# Patient Record
Sex: Female | Born: 1985 | Race: Asian | Hispanic: No | Marital: Married | Smoking: Never smoker
Health system: Southern US, Community
[De-identification: ages and names within clinical notes are randomized; demographics above are authoritative.]

## PROBLEM LIST (undated history)

## (undated) ENCOUNTER — Inpatient Hospital Stay: Payer: Self-pay

## (undated) DIAGNOSIS — Z789 Other specified health status: Secondary | ICD-10-CM

## (undated) DIAGNOSIS — E282 Polycystic ovarian syndrome: Secondary | ICD-10-CM

## (undated) DIAGNOSIS — D62 Acute posthemorrhagic anemia: Secondary | ICD-10-CM

## (undated) DIAGNOSIS — IMO0001 Reserved for inherently not codable concepts without codable children: Secondary | ICD-10-CM

## (undated) HISTORY — PX: DILATION AND CURETTAGE OF UTERUS: SHX78

## (undated) HISTORY — DX: Polycystic ovarian syndrome: E28.2

## (undated) HISTORY — PX: NO PAST SURGERIES: SHX2092

---

## 2014-03-11 NOTE — L&D Delivery Note (Signed)
Delivery Note At 9:35 AM a viable and healthy female was delivered via Vaginal, Spontaneous Delivery (Presentation: Right Occiput Anterior) after left mediolateral episiotomy due to tight hymenal/ perineal band.  APGAR: 8, 9; weight  -pending  Placenta status: Intact, Spontaneous.  Cord: 3 vessels with the following complications: .  Cord pH: N/A Anesthesia: Epidural and Local 1% Lidocaine in episiotomy area  Episiotomy: Left Mediolateral Lacerations: 2nd degree (no extension) Suture Repair: 3.0  Vicryl rapide Est. Blood Loss (mL): 350  Mom to AICU for postpartum magnesium for preeclampsia.  Baby to Couplet care / Skin to Skin.  MODY,Sheri Watkins 11/27/2014, 10:17 AM

## 2014-04-21 LAB — OB RESULTS CONSOLE HEPATITIS B SURFACE ANTIGEN: HEP B S AG: NEGATIVE

## 2014-04-21 LAB — OB RESULTS CONSOLE RUBELLA ANTIBODY, IGM: Rubella: IMMUNE

## 2014-04-21 LAB — OB RESULTS CONSOLE ABO/RH: RH TYPE: POSITIVE

## 2014-04-21 LAB — OB RESULTS CONSOLE HIV ANTIBODY (ROUTINE TESTING): HIV: NONREACTIVE

## 2014-04-21 LAB — OB RESULTS CONSOLE ANTIBODY SCREEN: ANTIBODY SCREEN: NEGATIVE

## 2014-04-21 LAB — OB RESULTS CONSOLE RPR: RPR: NONREACTIVE

## 2014-05-04 LAB — OB RESULTS CONSOLE GC/CHLAMYDIA
Chlamydia: NEGATIVE
GC PROBE AMP, GENITAL: NEGATIVE

## 2014-11-14 ENCOUNTER — Encounter (HOSPITAL_COMMUNITY): Payer: Self-pay | Admitting: *Deleted

## 2014-11-14 ENCOUNTER — Inpatient Hospital Stay (HOSPITAL_COMMUNITY)
Admission: AD | Admit: 2014-11-14 | Discharge: 2014-11-14 | Disposition: A | Discharge status: Home / Self Care | Payer: 59 | Source: Ambulatory Visit | Attending: Obstetrics | Admitting: Obstetrics

## 2014-11-14 DIAGNOSIS — Z3A37 37 weeks gestation of pregnancy: Secondary | ICD-10-CM | POA: Diagnosis not present

## 2014-11-14 DIAGNOSIS — R109 Unspecified abdominal pain: Secondary | ICD-10-CM | POA: Diagnosis present

## 2014-11-14 HISTORY — DX: Other specified health status: Z78.9

## 2014-11-14 LAB — COMPREHENSIVE METABOLIC PANEL
ALK PHOS: 154 U/L — AB (ref 38–126)
ALT: 11 U/L — ABNORMAL LOW (ref 14–54)
ANION GAP: 9 (ref 5–15)
AST: 18 U/L (ref 15–41)
Albumin: 2.7 g/dL — ABNORMAL LOW (ref 3.5–5.0)
BUN: 12 mg/dL (ref 6–20)
CALCIUM: 9.4 mg/dL (ref 8.9–10.3)
CO2: 23 mmol/L (ref 22–32)
Chloride: 102 mmol/L (ref 101–111)
Creatinine, Ser: 0.47 mg/dL (ref 0.44–1.00)
GFR calc non Af Amer: >60 mL/min (ref >60)
Glucose, Bld: 86 mg/dL (ref 65–99)
Potassium: 4.2 mmol/L (ref 3.5–5.1)
SODIUM: 134 mmol/L — AB (ref 135–145)
TOTAL PROTEIN: 6.6 g/dL (ref 6.5–8.1)
Total Bilirubin: 0.5 mg/dL (ref 0.3–1.2)

## 2014-11-14 LAB — URINALYSIS, DIPSTICK ONLY
BILIRUBIN URINE: NEGATIVE
Glucose, UA: NEGATIVE mg/dL
HGB URINE DIPSTICK: NEGATIVE
KETONES UR: NEGATIVE mg/dL
Nitrite: NEGATIVE
PROTEIN: NEGATIVE mg/dL
Specific Gravity, Urine: 1.01 (ref 1.005–1.030)
UROBILINOGEN UA: 0.2 mg/dL (ref 0.0–1.0)
pH: 7.5 (ref 5.0–8.0)

## 2014-11-14 LAB — CBC
HCT: 31.3 % — ABNORMAL LOW (ref 36.0–46.0)
HEMOGLOBIN: 10.2 g/dL — AB (ref 12.0–15.0)
MCH: 25.6 pg — AB (ref 26.0–34.0)
MCHC: 32.6 g/dL (ref 30.0–36.0)
MCV: 78.6 fL (ref 78.0–100.0)
Platelets: 223 10*3/uL (ref 150–400)
RBC: 3.98 MIL/uL (ref 3.87–5.11)
RDW: 18.3 % — ABNORMAL HIGH (ref 11.5–15.5)
WBC: 11.1 10*3/uL — ABNORMAL HIGH (ref 4.0–10.5)

## 2014-11-14 LAB — URIC ACID: URIC ACID, SERUM: 4.6 mg/dL (ref 2.3–6.6)

## 2014-11-14 NOTE — H&P (Signed)
Chief complaint: Abdominal pain  History of present illness: 29 year old G2 P0 010 at 37 weeks and 5 days who presents with one hour of abdominal pain around 8:30 this a.m. Patient notes intense abdominal cramping that brought her to tears. Good fetal movement, no leakage of fluid, no vaginal bleeding. Patient reports no headache, no vision change. Patient does report intense itching on her abdomen that worsens after hot shower  Obstetric history: GBS negative, normotensive throughout pregnancy, multiple complaints of abdominal pain throughout her pregnancy  Physical exam: Filed Vitals:   11/14/14 1019 11/14/14 1029 11/14/14 1048 11/14/14 1117  BP: 149/90 126/82 132/87 138/87  Pulse: 82 83 79 84  Temp: 98.1 F (36.7 C)     TempSrc: Oral     Resp: 18     Height:  (1.6 m)     Weight: 83.462 kg (184 lb)      general: Well-appearing, no distress Cardiovascular: Regular rate and rhythm Pulmonary: Clear to auscultation bilaterally Back: No costovertebral angle tenderness Abdomen: Gravid, size greater than dates, no fundal tenderness, no right upper quadrant pain Skin: Abdominal street a with fine macular papular red rash Lower extremities: Trace edema, nontender, 1+ DTR, no clonus  GU: Per nurse: Fingertip, long  Toco: None FH: 135, positive excels, no decelerations, 10 beat variability  CBC    Component Value Date/Time   WBC 11.1* 11/14/2014 1115   RBC 3.98 11/14/2014 1115   HGB 10.2* 11/14/2014 1115   HCT 31.3* 11/14/2014 1115   PLT 223 11/14/2014 1115   MCV 78.6 11/14/2014 1115   MCH 25.6* 11/14/2014 1115   MCHC 32.6 11/14/2014 1115   RDW 18.3* 11/14/2014 1115     CMP     Component Value Date/Time   NA 134* 11/14/2014 1115   K 4.2 11/14/2014 1115   CL 102 11/14/2014 1115   CO2 23 11/14/2014 1115   GLUCOSE 86 11/14/2014 1115   BUN 12 11/14/2014 1115   CREATININE 0.47 11/14/2014 1115   CALCIUM 9.4 11/14/2014 1115   PROT 6.6 11/14/2014 1115   ALBUMIN 2.7*  11/14/2014 1115   AST 18 11/14/2014 1115   ALT 11* 11/14/2014 1115   ALKPHOS 154* 11/14/2014 1115   BILITOT 0.5 11/14/2014 1115   GFRNONAA >60 11/14/2014 1115   GFRAA >60 11/14/2014 1115    Urine protein: Negative  Assessment and plan: 29 year old G2 P0 at 37 weeks and 5 days who presents with one hour of contractions for labor evaluation. No evidence of labor. Reactive fetal testing. While here patient had single elevated blood pressure. Patient has no other signs or symptoms of preeclampsia and labs are negative for preeclampsia. Labor and preeclamptic precautions reviewed with patient. Patient is to follow-up in 2 days time and has a scheduled routine obstetric visit with an ultrasound given her size greater than dates.  PUPPS. Finding discussed with patient. Okay for Benadryl at night, avoid hot showers, oatmeal bath recommend.  Antia Rahal A. 11/14/2014 12:55 PM

## 2014-11-14 NOTE — MAU Note (Signed)
States was having pain this morning at 0830, "too much pain." No pain right now. Wants to see what is going on.

## 2014-11-16 LAB — OB RESULTS CONSOLE GBS: STREP GROUP B AG: NEGATIVE

## 2014-11-26 ENCOUNTER — Encounter (HOSPITAL_COMMUNITY): Payer: Self-pay | Admitting: *Deleted

## 2014-11-26 ENCOUNTER — Inpatient Hospital Stay (HOSPITAL_COMMUNITY)
Admission: AD | Admit: 2014-11-26 | Discharge: 2014-11-29 | DRG: 774 | Disposition: A | Discharge status: Home / Self Care | Payer: 59 | Source: Ambulatory Visit | Attending: Obstetrics & Gynecology | Admitting: Obstetrics & Gynecology

## 2014-11-26 DIAGNOSIS — Z3A39 39 weeks gestation of pregnancy: Secondary | ICD-10-CM | POA: Diagnosis present

## 2014-11-26 DIAGNOSIS — O0903 Supervision of pregnancy with history of infertility, third trimester: Secondary | ICD-10-CM | POA: Diagnosis not present

## 2014-11-26 DIAGNOSIS — O1403 Mild to moderate pre-eclampsia, third trimester: Secondary | ICD-10-CM | POA: Diagnosis present

## 2014-11-26 DIAGNOSIS — D62 Acute posthemorrhagic anemia: Secondary | ICD-10-CM | POA: Diagnosis not present

## 2014-11-26 DIAGNOSIS — O9962 Diseases of the digestive system complicating childbirth: Secondary | ICD-10-CM | POA: Diagnosis present

## 2014-11-26 DIAGNOSIS — K219 Gastro-esophageal reflux disease without esophagitis: Secondary | ICD-10-CM | POA: Diagnosis present

## 2014-11-26 DIAGNOSIS — Z862 Personal history of diseases of the blood and blood-forming organs and certain disorders involving the immune mechanism: Secondary | ICD-10-CM | POA: Diagnosis present

## 2014-11-26 DIAGNOSIS — O09299 Supervision of pregnancy with other poor reproductive or obstetric history, unspecified trimester: Secondary | ICD-10-CM | POA: Diagnosis present

## 2014-11-26 DIAGNOSIS — O1490 Unspecified pre-eclampsia, unspecified trimester: Secondary | ICD-10-CM | POA: Diagnosis present

## 2014-11-26 DIAGNOSIS — O163 Unspecified maternal hypertension, third trimester: Secondary | ICD-10-CM | POA: Diagnosis present

## 2014-11-26 DIAGNOSIS — O9081 Anemia of the puerperium: Secondary | ICD-10-CM | POA: Diagnosis not present

## 2014-11-26 DIAGNOSIS — O1404 Mild to moderate pre-eclampsia, complicating childbirth: Secondary | ICD-10-CM | POA: Diagnosis present

## 2014-11-26 HISTORY — DX: Acute posthemorrhagic anemia: D62

## 2014-11-26 HISTORY — DX: Reserved for inherently not codable concepts without codable children: IMO0001

## 2014-11-26 LAB — COMPREHENSIVE METABOLIC PANEL
ALT: 14 U/L (ref 14–54)
ANION GAP: 8 (ref 5–15)
AST: 19 U/L (ref 15–41)
Albumin: 3 g/dL — ABNORMAL LOW (ref 3.5–5.0)
Alkaline Phosphatase: 190 U/L — ABNORMAL HIGH (ref 38–126)
BUN: 10 mg/dL (ref 6–20)
CHLORIDE: 104 mmol/L (ref 101–111)
CO2: 23 mmol/L (ref 22–32)
CREATININE: 0.43 mg/dL — AB (ref 0.44–1.00)
Calcium: 10 mg/dL (ref 8.9–10.3)
GFR calc non Af Amer: >60 mL/min (ref >60)
Glucose, Bld: 88 mg/dL (ref 65–99)
Potassium: 4.5 mmol/L (ref 3.5–5.1)
SODIUM: 135 mmol/L (ref 135–145)
Total Bilirubin: 0.3 mg/dL (ref 0.3–1.2)
Total Protein: 6.8 g/dL (ref 6.5–8.1)

## 2014-11-26 LAB — CBC
HCT: 33.4 % — ABNORMAL LOW (ref 36.0–46.0)
HEMOGLOBIN: 10.9 g/dL — AB (ref 12.0–15.0)
MCH: 26 pg (ref 26.0–34.0)
MCHC: 32.6 g/dL (ref 30.0–36.0)
MCV: 79.5 fL (ref 78.0–100.0)
Platelets: 233 10*3/uL (ref 150–400)
RBC: 4.2 MIL/uL (ref 3.87–5.11)
RDW: 18.9 % — ABNORMAL HIGH (ref 11.5–15.5)
WBC: 11.3 10*3/uL — ABNORMAL HIGH (ref 4.0–10.5)

## 2014-11-26 LAB — TYPE AND SCREEN
ABO/RH(D): A POS
Antibody Screen: NEGATIVE

## 2014-11-26 LAB — PROTEIN / CREATININE RATIO, URINE
Creatinine, Urine: 30 mg/dL
PROTEIN CREATININE RATIO: 0.5 mg/mg{creat} — AB (ref 0.00–0.15)
TOTAL PROTEIN, URINE: 15 mg/dL

## 2014-11-26 LAB — LACTATE DEHYDROGENASE: LDH: 138 U/L (ref 98–192)

## 2014-11-26 LAB — URIC ACID: URIC ACID, SERUM: 5.2 mg/dL (ref 2.3–6.6)

## 2014-11-26 MED ORDER — OXYCODONE-ACETAMINOPHEN 5-325 MG PO TABS: 1.0000 | ORAL_TABLET | ORAL | Status: DC | PRN

## 2014-11-26 MED ORDER — CITRIC ACID-SODIUM CITRATE 334-500 MG/5ML PO SOLN: 30.0000 mL | ORAL | Status: DC | PRN

## 2014-11-26 MED ORDER — LACTATED RINGERS IV SOLN
500.0000 mL | INTRAVENOUS | Status: DC | PRN
Start: 2014-11-26 — End: 2014-11-27
  Administered 2014-11-27 (×2): 500 mL via INTRAVENOUS

## 2014-11-26 MED ORDER — ONDANSETRON HCL 4 MG/2ML IJ SOLN: 4.0000 mg | Freq: Four times a day (QID) | INTRAMUSCULAR | Status: DC | PRN

## 2014-11-26 MED ORDER — TERBUTALINE SULFATE 1 MG/ML IJ SOLN: 0.2500 mg | Freq: Once | INTRAMUSCULAR | Status: DC | PRN

## 2014-11-26 MED ORDER — MISOPROSTOL 25 MCG QUARTER TABLET
25.0000 ug | ORAL_TABLET | ORAL | Status: AC | PRN
  Administered 2014-11-26: 25 ug via VAGINAL
  Filled 2014-11-26: qty 0.25

## 2014-11-26 MED ORDER — FLEET ENEMA 7-19 GM/118ML RE ENEM: 1.0000 | ENEMA | RECTAL | Status: DC | PRN

## 2014-11-26 MED ORDER — LIDOCAINE HCL (PF) 1 % IJ SOLN
30.0000 mL | INTRAMUSCULAR | Status: AC | PRN
  Administered 2014-11-27: 30 mL via SUBCUTANEOUS
  Filled 2014-11-26: qty 30

## 2014-11-26 MED ORDER — ACETAMINOPHEN 325 MG PO TABS: 650.0000 mg | ORAL_TABLET | ORAL | Status: DC | PRN

## 2014-11-26 MED ORDER — OXYCODONE-ACETAMINOPHEN 5-325 MG PO TABS: 2.0000 | ORAL_TABLET | ORAL | Status: DC | PRN

## 2014-11-26 MED ORDER — LABETALOL HCL 5 MG/ML IV SOLN
20.0000 mg | INTRAVENOUS | Status: DC | PRN
Start: 2014-11-26 — End: 2014-11-27

## 2014-11-26 MED ORDER — HYDROXYZINE HCL 10 MG PO TABS
10.0000 mg | ORAL_TABLET | Freq: Three times a day (TID) | ORAL | Status: DC | PRN
  Administered 2014-11-26: 10 mg via ORAL
  Filled 2014-11-26 (×2): qty 1

## 2014-11-26 MED ORDER — OXYTOCIN BOLUS FROM INFUSION
500.0000 mL | INTRAVENOUS | Status: DC
  Administered 2014-11-27: 500 mL via INTRAVENOUS

## 2014-11-26 MED ORDER — OXYTOCIN 40 UNITS IN LACTATED RINGERS INFUSION - SIMPLE MED
62.5000 mL/h | INTRAVENOUS | Status: DC
Start: 2014-11-26 — End: 2014-11-27
  Administered 2014-11-27: 62.5 mL/h via INTRAVENOUS
  Filled 2014-11-26: qty 1000

## 2014-11-26 MED ORDER — BUTORPHANOL TARTRATE 1 MG/ML IJ SOLN
1.0000 mg | INTRAMUSCULAR | Status: DC | PRN
  Administered 2014-11-26: 1 mg via INTRAVENOUS
  Filled 2014-11-26: qty 1

## 2014-11-26 MED ORDER — LACTATED RINGERS IV SOLN
INTRAVENOUS | Status: DC
  Administered 2014-11-26 – 2014-11-27 (×3): via INTRAVENOUS

## 2014-11-26 NOTE — H&P (Addendum)
Sheri Watkins is a 29 y.o. female presenting for labor check, c/o stronger UCs, denies fluid leak, reports good FMs. In MAU BPs were elevated and urine P/C ratio in 0.5, CBC/CMP normal. Admitting for labor IOL for mild preeclampsia.  [facility]. Dr Juliene Pina primary. [facility] conception (Infertility hx, anovulation, 3rd cycle). Several visits with abdo pain, some GERD, Anemia.   History OB History    Gravida Para Term Preterm AB TAB SAB Ectopic Multiple Living   0     Past Medical History  Diagnosis Date  . Medical history non-contributory    Past Surgical History  Procedure Laterality Date  . No past surgeries     Family History: family history is not on file. Social History:  reports that she has never smoked. She has never used smokeless tobacco. She reports that she does not drink alcohol or use illicit drugs.   Prenatal Transfer Tool  Maternal Diabetes: No Genetic Screening: Normal Maternal Ultrasounds/Referrals: Normal Fetal Ultrasounds or other Referrals:  None Maternal Substance Abuse:  No Significant Maternal Medications:  None Significant Maternal Lab Results:  Lab values include: Group B Strep negative Other Comments:  None  ROS  Neg   Dilation: 2 Effacement (%): 50 Station: -3 Exam by:: BShella Spearing RN  Blood pressure 156/81, pulse 85, temperature 98.5 F (36.9 C), temperature source Oral, resp. rate 18, height 5' (1.524 m), weight 189 lb (85.73 kg). Exam Physical Exam  Physical exam:  A&O x 3, no acute distress. Pleasant HEENT neg, no thyromegaly Lungs CTA bilat CV RRR, S1S2 normal Abdo soft, non tender, non acute Extr no edema/ tenderness. DTR +1/+1 Pelvic above per RN, my office exam this wk 1/30%/-4, Vtx FHT 135/ + accels/ no decels/ moderate variability- category I Toco regular q 3-4 min with Cytotec at admission  Prenatal labs: ABO, Rh: --/--/A POS (09/17 2015) Antibody: NEG (09/17 2015) Rubella: Immune (02/11 0000) RPR:  Nonreactive (02/11 0000)  HBsAg: Negative (02/11 0000)  HIV: Non-reactive (02/11 0000)  GBS: Negative (09/07 0000)   Assessment/Plan: .G2P0, 39.3 wks. Mild Preeclampsia (per BP and urine P/C ratio of 0.5 but no HELLP). Admit and augment labor. EFW 6.1/2-7 lbs. FHT- category I. Patient stable, no s/s of toxicity, will start magnesium PP Assess descent since high station and narrow/ convergent lateral wall but good candidate for vaginal attempt.    BPs ok, add Labetalol for >160/110 or symptomatic.  MODY,Sheri Watkins 11/26/2014, 11:18 PM

## 2014-11-26 NOTE — MAU Note (Signed)
Pt report she has ben having pain q 3 min since 5pm. Denies SROm or bleeding

## 2014-11-27 ENCOUNTER — Inpatient Hospital Stay (HOSPITAL_COMMUNITY): Payer: 59 | Admitting: Anesthesiology

## 2014-11-27 ENCOUNTER — Encounter (HOSPITAL_COMMUNITY): Payer: Self-pay | Admitting: Anesthesiology

## 2014-11-27 DIAGNOSIS — O1404 Mild to moderate pre-eclampsia, complicating childbirth: Secondary | ICD-10-CM | POA: Diagnosis present

## 2014-11-27 LAB — CBC
HCT: 33.4 % — ABNORMAL LOW (ref 36.0–46.0)
HCT: 30.3 % — ABNORMAL LOW (ref 36.0–46.0)
Hemoglobin: 10.9 g/dL — ABNORMAL LOW (ref 12.0–15.0)
Hemoglobin: 10 g/dL — ABNORMAL LOW (ref 12.0–15.0)
MCH: 25.7 pg — ABNORMAL LOW (ref 26.0–34.0)
MCH: 25.8 pg — AB (ref 26.0–34.0)
MCHC: 32.6 g/dL (ref 30.0–36.0)
MCHC: 33 g/dL (ref 30.0–36.0)
MCV: 78.8 fL (ref 78.0–100.0)
MCV: 78.3 fL (ref 78.0–100.0)
PLATELETS: 218 10*3/uL (ref 150–400)
PLATELETS: 200 10*3/uL (ref 150–400)
RBC: 4.24 MIL/uL (ref 3.87–5.11)
RBC: 3.87 MIL/uL (ref 3.87–5.11)
RDW: 19.1 % — AB (ref 11.5–15.5)
RDW: 19 % — AB (ref 11.5–15.5)
WBC: 12.7 10*3/uL — AB (ref 4.0–10.5)
WBC: 19.2 10*3/uL — ABNORMAL HIGH (ref 4.0–10.5)

## 2014-11-27 LAB — RPR: RPR: NONREACTIVE

## 2014-11-27 LAB — MAGNESIUM: MAGNESIUM: 4.4 mg/dL — AB (ref 1.7–2.4)

## 2014-11-27 LAB — ABO/RH: ABO/RH(D): A POS

## 2014-11-27 MED ORDER — FERROUS SULFATE 325 (65 FE) MG PO TABS
325.0000 mg | ORAL_TABLET | Freq: Every day | ORAL | Status: DC
  Administered 2014-11-27 – 2014-11-29 (×3): 325 mg via ORAL
  Filled 2014-11-27 (×3): qty 1

## 2014-11-27 MED ORDER — DIPHENHYDRAMINE HCL 25 MG PO CAPS: 25.0000 mg | ORAL_CAPSULE | Freq: Four times a day (QID) | ORAL | Status: DC | PRN

## 2014-11-27 MED ORDER — BENZOCAINE-MENTHOL 20-0.5 % EX AERO
1.0000 "application " | INHALATION_SPRAY | CUTANEOUS | Status: DC | PRN
  Administered 2014-11-28: 2 via TOPICAL
  Filled 2014-11-27: qty 112
  Filled 2014-11-27: qty 56

## 2014-11-27 MED ORDER — ACETAMINOPHEN 325 MG PO TABS
650.0000 mg | ORAL_TABLET | ORAL | Status: DC | PRN
  Administered 2014-11-28: 650 mg via ORAL
  Filled 2014-11-27: qty 2

## 2014-11-27 MED ORDER — LACTATED RINGERS IV SOLN: INTRAVENOUS | Status: DC

## 2014-11-27 MED ORDER — LIDOCAINE HCL (PF) 1 % IJ SOLN
INTRAMUSCULAR | Status: DC | PRN
  Administered 2014-11-27 (×2): 8 mL via EPIDURAL

## 2014-11-27 MED ORDER — EPHEDRINE 5 MG/ML INJ
10.0000 mg | INTRAVENOUS | Status: DC | PRN
  Filled 2014-11-27: qty 2

## 2014-11-27 MED ORDER — MAGNESIUM SULFATE BOLUS VIA INFUSION
4.0000 g | Freq: Once | INTRAVENOUS | Status: AC
  Administered 2014-11-27: 4 g via INTRAVENOUS
  Filled 2014-11-27: qty 500

## 2014-11-27 MED ORDER — OXYCODONE-ACETAMINOPHEN 5-325 MG PO TABS: 1.0000 | ORAL_TABLET | ORAL | Status: DC | PRN

## 2014-11-27 MED ORDER — PRENATAL MULTIVITAMIN CH
1.0000 | ORAL_TABLET | Freq: Every day | ORAL | Status: DC
  Administered 2014-11-27 – 2014-11-29 (×3): 1 via ORAL
  Filled 2014-11-27 (×3): qty 1

## 2014-11-27 MED ORDER — IBUPROFEN 600 MG PO TABS
600.0000 mg | ORAL_TABLET | Freq: Four times a day (QID) | ORAL | Status: DC
  Administered 2014-11-27 – 2014-11-29 (×7): 600 mg via ORAL
  Filled 2014-11-27 (×8): qty 1

## 2014-11-27 MED ORDER — PHENYLEPHRINE 40 MCG/ML (10ML) SYRINGE FOR IV PUSH (FOR BLOOD PRESSURE SUPPORT)
80.0000 ug | PREFILLED_SYRINGE | INTRAVENOUS | Status: DC | PRN
  Administered 2014-11-27: 80 ug via INTRAVENOUS
  Filled 2014-11-27: qty 20
  Filled 2014-11-27: qty 2

## 2014-11-27 MED ORDER — DIPHENHYDRAMINE HCL 50 MG/ML IJ SOLN
12.5000 mg | INTRAMUSCULAR | Status: DC | PRN
Start: 2014-11-27 — End: 2014-11-27

## 2014-11-27 MED ORDER — ONDANSETRON HCL 4 MG PO TABS: 4.0000 mg | ORAL_TABLET | ORAL | Status: DC | PRN

## 2014-11-27 MED ORDER — FENTANYL 2.5 MCG/ML BUPIVACAINE 1/10 % EPIDURAL INFUSION (WH - ANES)
14.0000 mL/h | INTRAMUSCULAR | Status: DC | PRN
  Administered 2014-11-27 (×2): 14 mL/h via EPIDURAL
  Filled 2014-11-27: qty 125

## 2014-11-27 MED ORDER — LANOLIN HYDROUS EX OINT: TOPICAL_OINTMENT | CUTANEOUS | Status: DC | PRN

## 2014-11-27 MED ORDER — SENNOSIDES-DOCUSATE SODIUM 8.6-50 MG PO TABS
2.0000 | ORAL_TABLET | ORAL | Status: DC
  Administered 2014-11-27 – 2014-11-28 (×2): 2 via ORAL
  Filled 2014-11-27 (×2): qty 2

## 2014-11-27 MED ORDER — MAGNESIUM SULFATE 50 % IJ SOLN
2.0000 g/h | INTRAVENOUS | Status: AC
  Administered 2014-11-27 – 2014-11-28 (×2): 2 g/h via INTRAVENOUS
  Filled 2014-11-27 (×2): qty 80

## 2014-11-27 MED ORDER — DIBUCAINE 1 % RE OINT
1.0000 | TOPICAL_OINTMENT | RECTAL | Status: DC | PRN
Start: 2014-11-27 — End: 2014-11-29

## 2014-11-27 MED ORDER — SIMETHICONE 80 MG PO CHEW: 80.0000 mg | CHEWABLE_TABLET | ORAL | Status: DC | PRN

## 2014-11-27 MED ORDER — ONDANSETRON HCL 4 MG/2ML IJ SOLN: 4.0000 mg | INTRAMUSCULAR | Status: DC | PRN

## 2014-11-27 MED ORDER — WITCH HAZEL-GLYCERIN EX PADS: 1.0000 "application " | MEDICATED_PAD | CUTANEOUS | Status: DC | PRN

## 2014-11-27 MED ORDER — TETANUS-DIPHTH-ACELL PERTUSSIS 5-2.5-18.5 LF-MCG/0.5 IM SUSP
0.5000 mL | Freq: Once | INTRAMUSCULAR | Status: DC
  Filled 2014-11-27: qty 0.5

## 2014-11-27 NOTE — Anesthesia Preprocedure Evaluation (Signed)

## 2014-11-27 NOTE — Progress Notes (Signed)
Sheri Watkins is a 29 y.o. G2P0010 at [redacted]w[redacted]d by ultrasound admitted for Preeclampsia IOL   Subjective: Pain getting worse  Objective: BP 120/108 mmHg  Pulse 82  Temp(Src) 97.8 F (36.6 C) (Oral)  Resp 18  Ht 5' (1.524 m)  Wt 189 lb (85.73 kg)  BMI 36.91 kg/m2      FHT:  FHR: 140 bpm, variability: moderate,  accelerations:  Present,  decelerations:  Absent UC:   regular, every 3 minutes SVE:   Dilation: 5 Effacement (%): 90 Station: -3 Exam by:: Dr. Juliene Pina AROM thin med fluid. Station high but well applied to cervix after AROM.   Labs: Lab Results  Component Value Date   WBC 11.3* 11/26/2014   HGB 10.9* 11/26/2014   HCT 33.4* 11/26/2014   MCV 79.5 11/26/2014   PLT 233 11/26/2014    Assessment / Plan: Augmentation of labor, progressing well, CBC now, epidural when CBC back.   Labor: Progressing normally Preeclampsia:  no signs or symptoms of toxicity Fetal Wellbeing:  Category I Pain Control:  Epidural I/D:  n/a Anticipated MOD:  Guarded, assess flexion and descent  MODY,Sheri Watkins 11/27/2014, 2:08 AM

## 2014-11-27 NOTE — Anesthesia Procedure Notes (Signed)
Epidural Patient location during procedure: OB Start time: 11/27/2014 3:10 AM End time: 11/27/2014 3:14 AM  Staffing Anesthesiologist: Leilani Able  Preanesthetic Checklist Completed: patient identified, surgical consent, pre-op evaluation, timeout performed, IV checked, risks and benefits discussed and monitors and equipment checked  Epidural Patient position: sitting Prep: site prepped and draped and DuraPrep Patient monitoring: continuous pulse ox and blood pressure Approach: midline Location: L3-L4 Injection technique: LOR air  Needle:  Needle type: Tuohy  Needle gauge: 17 G Needle length: 9 cm and 9 Needle insertion depth: 5 cm cm Catheter type: closed end flexible Catheter size: 19 Gauge Catheter at skin depth: 10 cm Test dose: negative and Other  Assessment Sensory level: T9 Events: blood not aspirated, injection not painful, no injection resistance, negative IV test and no paresthesia  Additional Notes Reason for block:procedure for pain

## 2014-11-27 NOTE — Progress Notes (Signed)
Patient's heart rate 110s-130s.  Patient denies pain, dizziness, or shortness of breath.  Patient has been up to void often and tolerated well.  Dr. Juliene Pina notified.  Advised to check temperature (see flowsheet).  No additional orders.  Will continue to monitor.

## 2014-11-28 ENCOUNTER — Encounter (HOSPITAL_COMMUNITY): Payer: Self-pay | Admitting: Obstetrics and Gynecology

## 2014-11-28 DIAGNOSIS — D62 Acute posthemorrhagic anemia: Secondary | ICD-10-CM

## 2014-11-28 DIAGNOSIS — IMO0001 Reserved for inherently not codable concepts without codable children: Secondary | ICD-10-CM

## 2014-11-28 DIAGNOSIS — Z862 Personal history of diseases of the blood and blood-forming organs and certain disorders involving the immune mechanism: Secondary | ICD-10-CM | POA: Diagnosis present

## 2014-11-28 HISTORY — DX: Acute posthemorrhagic anemia: D62

## 2014-11-28 HISTORY — DX: Reserved for inherently not codable concepts without codable children: IMO0001

## 2014-11-28 LAB — COMPREHENSIVE METABOLIC PANEL
ALBUMIN: 2.3 g/dL — AB (ref 3.5–5.0)
ALT: 12 U/L — ABNORMAL LOW (ref 14–54)
ANION GAP: 6 (ref 5–15)
AST: 21 U/L (ref 15–41)
Alkaline Phosphatase: 129 U/L — ABNORMAL HIGH (ref 38–126)
BUN: 7 mg/dL (ref 6–20)
CO2: 22 mmol/L (ref 22–32)
Calcium: 8.2 mg/dL — ABNORMAL LOW (ref 8.9–10.3)
Chloride: 107 mmol/L (ref 101–111)
Creatinine, Ser: 0.47 mg/dL (ref 0.44–1.00)
GFR calc Af Amer: >60 mL/min (ref >60)
GFR calc non Af Amer: >60 mL/min (ref >60)
GLUCOSE: 112 mg/dL — AB (ref 65–99)
POTASSIUM: 4 mmol/L (ref 3.5–5.1)
SODIUM: 135 mmol/L (ref 135–145)
Total Bilirubin: 0.4 mg/dL (ref 0.3–1.2)
Total Protein: 5.7 g/dL — ABNORMAL LOW (ref 6.5–8.1)

## 2014-11-28 LAB — CBC
HCT: 27.6 % — ABNORMAL LOW (ref 36.0–46.0)
HEMOGLOBIN: 9 g/dL — AB (ref 12.0–15.0)
MCH: 25.9 pg — ABNORMAL LOW (ref 26.0–34.0)
MCHC: 32.6 g/dL (ref 30.0–36.0)
MCV: 79.3 fL (ref 78.0–100.0)
Platelets: 195 10*3/uL (ref 150–400)
RBC: 3.48 MIL/uL — ABNORMAL LOW (ref 3.87–5.11)
RDW: 19.6 % — AB (ref 11.5–15.5)
WBC: 13.4 10*3/uL — ABNORMAL HIGH (ref 4.0–10.5)

## 2014-11-28 MED ORDER — SODIUM CHLORIDE 0.9 % IJ SOLN: 3.0000 mL | INTRAMUSCULAR | Status: DC | PRN

## 2014-11-28 MED ORDER — SODIUM CHLORIDE 0.9 % IJ SOLN
3.0000 mL | Freq: Two times a day (BID) | INTRAMUSCULAR | Status: DC
Start: 2014-11-28 — End: 2014-11-29

## 2014-11-28 NOTE — Progress Notes (Signed)
Patient ID: Sheri Watkins, female   DOB: Aug 02, 1985, 29 y.o.   MRN: [id number] PPD # 1 SVD  S:  Reports feeling better, but "hot and flushed from Magnesium medicine"             Tolerating po/ No nausea or vomiting             Bleeding is light             Pain controlled with ibuprofen (OTC)             Up ad lib / ambulatory / voiding without difficulties    Newborn  Information for the patient's newborn:  [name], Leipold [[id number]]  female  breast feeding     O:  A & O x 3, in no apparent distress              VS:  Filed Vitals:   11/28/14 0705 11/28/14 0813 11/28/14 0906 11/28/14 1006  BP: 141/76 130/68 131/80 138/69  Pulse: 98 95 108 102  Temp:  98.4 F (36.9 C)    TempSrc:  Oral    Resp: Height:      Weight:      SpO2:        LABS:  Recent Labs  11/27/14 1045 11/28/14 0600  WBC 19.2* 13.4*  HGB 10.0* 9.0*  HCT 30.3* 27.6*  PLT 200 195  Magnesium level 4.4   Blood type: A POS (09/17 2015)  Rubella: Immune (02/11 0000)   I&O: I/O last 3 completed shifts: In: 2590 [P.O.:1680; I.V.:910] Out: 5075 [Urine:4725; Blood:350]          Total I/O In: 495 [P.O.:360; I.V.:135] Out: 2000 [Urine:2000]  Lungs: Clear and unlabored  Heart: regular rate and rhythm / no murmurs  Abdomen: soft, non-tender, non-distended              Fundus: firm, non-tender, U-1  Perineum: 2nd degree LT mediolateral episiotomy repair healing well  Lochia: minimal  Extremities: No edema, no calf pain or tenderness, No Homans    A/P: PPD # 1  29 y.o., [id number]   Principal Problem:    Postpartum care following vaginal delivery (9/18)  Active Problems:    Elevated blood pressure complicating pregnancy in third trimester, antepartum    Pregnancy induced hypertension    Mild preeclampsia delivered - labs improving    Maternal iron deficiency anemia    Acute blood loss anemia   Doing well - stable status  Routine post partum orders  D/C Magnesium Sulfate per Dr.  Billy Coast  Transfer to Jacquenette Shone, Terence Lux, MSN, CNM 11/28/2014, 10:39 AM

## 2014-11-28 NOTE — Progress Notes (Signed)
UR chart review completed.  

## 2014-11-28 NOTE — Anesthesia Postprocedure Evaluation (Signed)
  Anesthesia Post-op Note  Patient: Sheri Watkins  Procedure(s) Performed: * No procedures listed *  Patient Location: Antenatal  Anesthesia Type:Epidural  Level of Consciousness: awake, alert , oriented and patient cooperative  Airway and Oxygen Therapy: Patient Spontanous Breathing  Post-op Pain: moderate  Post-op Assessment: Patient's Cardiovascular Status Stable, Respiratory Function Stable, Pain level controlled, No headache and Patient able to bend at knees;mod back pain but ambulates and bears weight without problem;no residual numbness in feet and legs;told to contact anesthesia via RN if back pain worsened.              Post-op Vital Signs: Reviewed and stable  Last Vitals:  Filed Vitals:   11/28/14 0705  BP: 141/76  Pulse: 98  Temp:   Resp: 18    Complications: No apparent anesthesia complications

## 2014-11-29 MED ORDER — DOCUSATE SODIUM 100 MG PO CAPS
100.0000 mg | ORAL_CAPSULE | Freq: Two times a day (BID) | ORAL | Status: DC
  Administered 2014-11-29: 100 mg via ORAL
  Filled 2014-11-29: qty 1

## 2014-11-29 MED ORDER — MAGNESIUM OXIDE 400 (241.3 MG) MG PO TABS
200.0000 mg | ORAL_TABLET | Freq: Every day | ORAL | Status: DC
  Administered 2014-11-29: 200 mg via ORAL
  Filled 2014-11-29 (×2): qty 0.5

## 2014-11-29 MED ORDER — DOCUSATE SODIUM 100 MG PO CAPS: 100.0000 mg | ORAL_CAPSULE | Freq: Two times a day (BID) | ORAL | Status: DC

## 2014-11-29 MED ORDER — IBUPROFEN 600 MG PO TABS: 600.0000 mg | ORAL_TABLET | Freq: Four times a day (QID) | ORAL | Status: DC

## 2014-11-29 MED ORDER — MAGNESIUM OXIDE 400 (241.3 MG) MG PO TABS: 200.0000 mg | ORAL_TABLET | Freq: Every day | ORAL | Status: DC

## 2014-11-29 NOTE — Progress Notes (Addendum)
PPD #2- SVD  Subjective:   Reports feeling well No HA, visual disturbances, or epigastric pain Tolerating po/ No nausea or vomiting/ no BM since before delivery-worried about constipation/hard stools Bleeding is light Pain controlled with Motrin Up ad lib / ambulatory / voiding without problems Newborn: breastfeeding    Objective:   VS: VS:  Filed Vitals:   11/28/14 2134 11/29/14 0011 11/29/14 0400 11/29/14 0831  BP: 134/82 131/80 130/84   Pulse: 87 80 77   Temp: 98.5 F (36.9 C) 97.9 F (36.6 C) 97.9 F (36.6 C)   TempSrc:      Resp: Height:      Weight:    84.029 kg (185 lb 4 oz)  SpO2:        LABS:  Recent Labs  11/27/14 1045 11/28/14 0600  WBC 19.2* 13.4*  HGB 10.0* 9.0*  PLT 200 195   Blood type: --/--/A POS, A POS (09/17 2015) Rubella: Immune (02/11 0000)                I&O: Intake/Output      09/19 0701 - 09/20 0700 09/20 0701 - 09/21 0700   P.O. 800    I.V. (mL/kg) 191.3 (2.2)    Total Intake(mL/kg) 991.3 (11.6)    Urine (mL/kg/hr) 3450 (1.7) 500 (1.8)   Blood     Total Output 3450 500   Net -2458.8 -500          Physical Exam: Alert and oriented X3 Heart: RRR Lungs: CTA bilat Abdomen: soft, non-tender, non-distended  Fundus: firm, non-tender, U-2 Perineum: Well approximated, no significant erythema, edema, or drainage; healing well. Lochia: small Extremities: No edema, no calf pain or tenderness   Assessment: PPD #2  G2P1011/ S/P:spontaneous vaginal, episiotomy Preeclampsia, delivered-stable IDA with compounding ABL anemia Doing well - stable for discharge home  Plan: Discharge home RX's:  Ibuprofen  po Q 6 hrs prn pain #30 Refill x 0 Ferrous Sulfate 325 mg 1 po daily (has Rx) Mag Oxide 200 mg po daily Colace 100 mg po bid #60, no refill Follow-up in 1 week at [facility] for BP check Routine pp visit in 6wks at [facility] booklet given    Donette Larry, N MSN, CNM 11/29/2014, 10:21 AM

## 2014-11-29 NOTE — Lactation Note (Signed)
This note was copied from the chart of Sheri Verlia Wirkkala. Lactation Consultation Note  Addendum: Infant with 3 voids and 4 stools in last 24 hours. She has had 10 BF for 10-30 minutes each.   Patient Name: Sheri Watkins'U Date: 11/29/2014 Reason for consult: Initial assessment   Maternal Data Formula Feeding for Exclusion: No Has patient been taught Hand Expression?: Yes Does the patient have breastfeeding experience prior to this delivery?: No  Feeding Feeding Type: Breast Fed Length of feed: 10 min  LATCH Score/Interventions Latch: Grasps breast easily, tongue down, lips flanged, rhythmical sucking.  Audible Swallowing: Spontaneous and intermittent Intervention(s): Skin to skin  Type of Nipple: Everted at rest and after stimulation  Comfort (Breast/Nipple): Filling, red/small blisters or bruises, mild/mod discomfort  Problem noted: Mild/Moderate discomfort Interventions (Mild/moderate discomfort): Hand massage;Hand expression  Hold (Positioning): Assistance needed to correctly position infant at breast and maintain latch. Intervention(s): Breastfeeding basics reviewed;Position options;Skin to skin  LATCH Score: 8  Lactation Tools Discussed/Used Pump Review: Setup, frequency, and cleaning;Milk Storage Initiated by:: Skeet Latch, RN, IBCLC Date initiated:: 11/29/14   Consult Status Consult Status: Complete    Silas Flood Hice 11/29/2014, 8:39 AM

## 2014-11-29 NOTE — Discharge Summary (Signed)
DISCHARGE SUMMARY:  Patient ID: Sheri Watkins MRN: [id number] DOB/AGE: November 29, 1985 29 y.o.  Admit date: 11/26/2014 Admission Diagnoses: 39.[redacted] weeks gestation, Mild preeclampsia   Discharge date: 11/29/2014 Discharge Diagnoses: S/P SVD on 11/27/14; IDA with compounding ABL anemia; PEC, delivered      Prenatal history: G2P1011   EDC: 11/30/2014, Alternate EDD Entry  Prenatal care at [facility] since [redacted] wks gestation. Primary provider: Dr. Juliene Pina Prenatal course complicated by Femara conception (Infertility hx, anovulation, 3rd cycle). Several visits with abd pain, GERD, Anemia.   Prenatal labs: ABO, Rh: --/--/A POS, A POS (09/17 2015) Antibody: NEG (09/17 2015) Rubella:   Immune RPR: Non Reactive (09/17 2015)  HBsAg: Negative (02/11 0000)  HIV: Non-reactive (02/11 0000)  GBS: Negative (09/07 0000)  GTT: 126  Medical / Surgical History :  Past medical history:  Past Medical History  Diagnosis Date  . Medical history non-contributory   . Maternal iron deficiency anemia 11/28/2014  . Acute blood loss anemia 11/28/2014  . Postpartum care following vaginal delivery (9/18) 11/28/2014    Past surgical history:  Past Surgical History  Procedure Laterality Date  . No past surgeries       Medications on Admission: Prescriptions prior to admission  Medication Sig Dispense Refill Last Dose  . acetaminophen (TYLENOL) 325 MG tablet Take 650 mg by mouth every 6 (six) hours as needed for mild pain.   11/25/2014 at Unknown time  . ferrous sulfate 325 (65 FE) MG tablet Take 325 mg by mouth daily.   11/26/2014 at Unknown time  . Prenatal Vit-Fe Fumarate-FA (PRENATAL MULTIVITAMIN) TABS tablet Take 1 tablet by mouth daily at 12 noon.   11/25/2014 at Unknown time    Allergies: Review of patient's allergies indicates no known allergies.   Intrapartum Course:  Admitted for IOL for PEC. AROM, epidural, episiotomy, SVD.  Postpartum Course: Complicated by AICU admission for  MgSO4 x24 hrs. BPs and labs stable-transfered to pp unit for routine care. IDA with compounding ABL anemia.  Physical Exam:   VSS: Blood pressure 130/84, pulse 77, temperature 97.9 F (36.6 C), temperature source Oral, resp. rate 20, height 5' (1.524 m), weight 84.029 kg (185 lb 4 oz), SpO2 90 %, unknown if currently breastfeeding.  LABS:  Recent Labs  11/27/14 1045 11/28/14 0600  WBC 19.2* 13.4*  HGB 10.0* 9.0*  PLT 200 195    General: alert and oriented x3 Heart: RRR Lungs: CTA bilaterally GI: soft, non-tender, non-distended, BS x4 Lochia: small Uterus: firm below umbilicus Incision: well approximated; honeycomb dressing-no significant erythema, drainage, or edema Extremities: No edema, Homans neg   Newborn Data Live born female  Birth Weight: 7 lb 2.6 oz (3249 g) APGAR: 8, 9  See operative report for further details  Home with mother.  Discharge Instructions:  Postpartum Instructions: [facility] discharge booklet - instructions reviewed Medications:    Medication List    STOP taking these medications        acetaminophen 325 MG tablet  Commonly known as:  TYLENOL      TAKE these medications        docusate sodium 100 MG capsule  Commonly known as:  COLACE  Take 1 capsule (100 mg total) by mouth 2 (two) times daily.     ferrous sulfate 325 (65 FE) MG tablet  Take 325 mg by mouth daily.     ibuprofen 600 MG tablet  Commonly known as:  ADVIL,MOTRIN  Take 1 tablet (600 mg total) by mouth every 6 (  six) hours.     magnesium oxide 400 (241.3 MG) MG tablet  Commonly known as:  MAG-OX  Take 0.5 tablets (200 mg total) by mouth daily.     prenatal multivitamin Tabs tablet  Take 1 tablet by mouth daily at 12 noon.            Follow-up Information    Follow up with MODY,[name], MD In 1 week.   Specialty:  Obstetrics and Gynecology   Why:  blood pressure check   Contact information:   [address] [place] [address] [phone]        Follow up with MODY,[name], MD. Schedule an appointment as soon as possible for a visit in 6 weeks.   Specialty:  Obstetrics and Gynecology   Why:  postpartum visit   Contact information:   [address] [place] [address] [phone]         Signed: Donette Larry, Dorris Carnes MSN, CNM 11/29/2014, 10:33 AM

## 2014-11-29 NOTE — Lactation Note (Signed)
This note was copied from the chart of Sheri Jeanae Snider. Lactation Consultation Note  Initial Consult with mom prior to d/c. Baby is awake and cueing to feed. Mom wanted to use side lying position as she is feeling very tired. Breasts are soft and warm, mom noted them to be fuller today. Mom reports there is pain with feedings most of the time. Assisted mom in latching infant. Infant latched readily with wide open mouth that needed minor lip flanging. Parents were aware of BF basics and said they have been taught about different positions.  Parents report infant sleeps at the breast. Encouraged them to gently stimulate infant to keep awake while feeding and enc mom to massage and compress breast throughout feed.  Mom reports no pain with this feeding after initial latch. Parents did give one bottle due to cluster feeding, enc her to place infant at breast and relatch and flange lips as needed for comfort to nipples and to maximize milk transfer. No trauma noted to nipples. Infant nursed approximately 10 minutes with audible swallows. Enc 8-12 feeds in 24 hours at first feeding cues. Enc. parents to maintain feeding log. Referred parents to Taking Care of Baby and Me Booklet for output requirements, engorgement and BM storage. Mom is able to hand express and requested a hand pump for occasional pumping. Hand pump with instructions for set up, use, and cleaning given. Parents with lots of great questions that were answered. Parents voices understanding of all teaching. Parents did have a pacifier in the room, enc them to avoid pacifiers for the first month to allow BF to be well established. [facility] handout given with phone #. Parents were informed of BF Resources, OP services, and Support Groups. Baby has a Optometrist appointment tomorrow in [place]. Advised to call prn questions/concerns.  Patient Name: Sheri Watkins [id number] Date: 11/29/2014 Reason for consult: Initial  assessment   Maternal Data Formula Feeding for Exclusion: No Has patient been taught Hand Expression?: Yes Does the patient have breastfeeding experience prior to this delivery?: No  Feeding Feeding Type: Breast Fed Length of feed: 10 min  LATCH Score/Interventions Latch: Grasps breast easily, tongue down, lips flanged, rhythmical sucking.  Audible Swallowing: Spontaneous and intermittent Intervention(s): Skin to skin  Type of Nipple: Everted at rest and after stimulation  Comfort (Breast/Nipple): Filling, red/small blisters or bruises, mild/mod discomfort  Problem noted: Mild/Moderate discomfort Interventions (Mild/moderate discomfort): Hand massage;Hand expression  Hold (Positioning): Assistance needed to correctly position infant at breast and maintain latch. Intervention(s): Breastfeeding basics reviewed;Position options;Skin to skin  LATCH Score: 8  Lactation Tools Discussed/Used Pump Review: Setup, frequency, and cleaning;Milk Storage Initiated by:: Skeet Latch, RN, IBCLC Date initiated:: 11/29/14   Consult Status Consult Status: Complete    Silas Flood Hice 11/29/2014, 8:25 AM

## 2015-09-23 ENCOUNTER — Encounter: Payer: Self-pay | Admitting: Emergency Medicine

## 2015-09-23 ENCOUNTER — Emergency Department
Admission: EM | Admit: 2015-09-23 | Discharge: 2015-09-23 | Disposition: A | Discharge status: Home / Self Care | Payer: No Typology Code available for payment source | Attending: Emergency Medicine | Admitting: Emergency Medicine

## 2015-09-23 ENCOUNTER — Emergency Department: Payer: No Typology Code available for payment source

## 2015-09-23 DIAGNOSIS — S63502A Unspecified sprain of left wrist, initial encounter: Secondary | ICD-10-CM | POA: Diagnosis not present

## 2015-09-23 DIAGNOSIS — S161XXA Strain of muscle, fascia and tendon at neck level, initial encounter: Secondary | ICD-10-CM | POA: Diagnosis not present

## 2015-09-23 DIAGNOSIS — S39012A Strain of muscle, fascia and tendon of lower back, initial encounter: Secondary | ICD-10-CM

## 2015-09-23 DIAGNOSIS — Y999 Unspecified external cause status: Secondary | ICD-10-CM | POA: Insufficient documentation

## 2015-09-23 DIAGNOSIS — Y939 Activity, unspecified: Secondary | ICD-10-CM | POA: Insufficient documentation

## 2015-09-23 DIAGNOSIS — Y92410 Unspecified street and highway as the place of occurrence of the external cause: Secondary | ICD-10-CM | POA: Diagnosis not present

## 2015-09-23 DIAGNOSIS — S199XXA Unspecified injury of neck, initial encounter: Secondary | ICD-10-CM | POA: Diagnosis present

## 2015-09-23 LAB — POCT PREGNANCY, URINE: Preg Test, Ur: NEGATIVE

## 2015-09-23 MED ORDER — TRAMADOL HCL 50 MG PO TABS: 50.0000 mg | ORAL_TABLET | Freq: Four times a day (QID) | ORAL | Status: DC | PRN

## 2015-09-23 MED ORDER — IBUPROFEN 600 MG PO TABS: 600.0000 mg | ORAL_TABLET | Freq: Three times a day (TID) | ORAL | Status: DC | PRN

## 2015-09-23 MED ORDER — IBUPROFEN 600 MG PO TABS
600.0000 mg | ORAL_TABLET | Freq: Once | ORAL | Status: AC
Start: 2015-09-23 — End: 2015-09-23
  Administered 2015-09-23: 600 mg via ORAL
  Filled 2015-09-23: qty 1

## 2015-09-23 MED ORDER — METHOCARBAMOL 500 MG PO TABS
1000.0000 mg | ORAL_TABLET | Freq: Once | ORAL | Status: AC
  Administered 2015-09-23: 1000 mg via ORAL
  Filled 2015-09-23: qty 2

## 2015-09-23 MED ORDER — METHOCARBAMOL 750 MG PO TABS: 750.0000 mg | ORAL_TABLET | Freq: Four times a day (QID) | ORAL | Status: DC

## 2015-09-23 MED ORDER — TRAMADOL HCL 50 MG PO TABS
50.0000 mg | ORAL_TABLET | Freq: Once | ORAL | Status: AC
  Administered 2015-09-23: 50 mg via ORAL
  Filled 2015-09-23: qty 1
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
## 2015-09-23 NOTE — Discharge Instructions (Signed)
Wear splint for 3-5 days as needed. Cervical Sprain A cervical sprain is when the tissues (ligaments) that hold the neck bones in place stretch or tear. HOME CARE   Put ice on the injured area.  Put ice in a plastic bag.  Place a towel between your skin and the bag.  Leave the ice on for 15-20 minutes, 3-4 times a day.  You may have been given a collar to wear. This collar keeps your neck from moving while you heal.  Do not take the collar off unless told by your doctor.  If you have long hair, keep it outside of the collar.  Ask your doctor before changing the position of your collar. You may need to change its position over time to make it more comfortable.  If you are allowed to take off the collar for cleaning or bathing, follow your doctor's instructions on how to do it safely.  Keep your collar clean by wiping it with mild soap and water. Dry it completely. If the collar has removable pads, remove them every 1-2 days to hand wash them with soap and water. Allow them to air dry. They should be dry before you wear them in the collar.  Do not drive while wearing the collar.  Only take medicine as told by your doctor.  Keep all doctor visits as told.  Keep all physical therapy visits as told.  Adjust your work station so that you have good posture while you work.  Avoid positions and activities that make your problems worse.  Warm up and stretch before being active. GET HELP IF:  Your pain is not controlled with medicine.  You cannot take less pain medicine over time as planned.  Your activity level does not improve as expected. GET HELP RIGHT AWAY IF:   You are bleeding.  Your stomach is upset.  You have an allergic reaction to your medicine.  You develop new problems that you cannot explain.  You lose feeling (become numb) or you cannot move any part of your body (paralysis).  You have tingling or weakness in any part of your body.  Your symptoms get  worse. Symptoms include:  Pain, soreness, stiffness, puffiness (swelling), or a burning feeling in your neck.  Pain when your neck is touched.  Shoulder or upper back pain.  Limited ability to move your neck.  Headache.  Dizziness.  Your hands or arms feel week, lose feeling, or tingle.  Muscle spasms.  Difficulty swallowing or chewing. MAKE SURE YOU:   Understand these instructions.  Will watch your condition.  Will get help right away if you are not doing well or get worse.   This information is not intended to replace advice given to you by your health care provider. Make sure you discuss any questions you have with your health care provider.   Document Released: 08/14/2007 Document Revised: 10/28/2012 Document Reviewed: 09/02/2012 Elsevier Interactive Patient Education [employer]2016 [employer]vier Inc.
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
## 2016-03-14 ENCOUNTER — Encounter: Payer: Self-pay | Admitting: Obstetrics and Gynecology

## 2016-03-14 ENCOUNTER — Ambulatory Visit (INDEPENDENT_AMBULATORY_CARE_PROVIDER_SITE_OTHER): Payer: Medicaid Other | Admitting: Obstetrics and Gynecology

## 2016-03-14 VITALS — BP 108/76 | HR 125 | Ht 66.0 in | Wt 178.6 lb

## 2016-03-14 DIAGNOSIS — N912 Amenorrhea, unspecified: Secondary | ICD-10-CM | POA: Diagnosis not present

## 2016-03-14 DIAGNOSIS — E663 Overweight: Secondary | ICD-10-CM | POA: Insufficient documentation

## 2016-03-14 DIAGNOSIS — O1490 Unspecified pre-eclampsia, unspecified trimester: Secondary | ICD-10-CM | POA: Diagnosis not present

## 2016-03-14 DIAGNOSIS — N926 Irregular menstruation, unspecified: Secondary | ICD-10-CM

## 2016-03-14 LAB — POCT URINE PREGNANCY: Preg Test, Ur: NEGATIVE

## 2016-03-14 MED ORDER — MEDROXYPROGESTERONE ACETATE 10 MG PO TABS: 10.0000 mg | ORAL_TABLET | Freq: Every day | ORAL | 0 refills | Status: DC

## 2016-03-14 NOTE — Patient Instructions (Signed)
1. Urine pregnancy test is negative 2. Provera 10 mg a day for 10 days is prescribed. He will have a period 7-10 days after medicine is finished. 3. Blood work is ordered. Return at your convenience to have the labs drawn. 4. Continue taking prenatal vitamins daily 5. Continue taking iron once a day 6. Maintain menstrual calendar monitoring 7. Return in 3 months for follow-up

## 2016-03-14 NOTE — Progress Notes (Signed)
GYN ENCOUNTER NOTE  Subjective:       Sheri Watkins is a 31 y.o. G8P1011 female is here for gynecologic evaluation of the following issues:  1. Irregular menstrual cycles  Long history of irregular menstrual cycles. Status post spontaneous vaginal delivery in September 2016, complicated by preeclampsia and iron deficiency anemia. Patient is status post breast-feeding for 8 months. First menses following childbirth was in September 2017. She had 1 day of bleeding in November 2017.  No history of thyroid disease Patient denies galactorrhea at this time Patient reports mild increased facial hair growth requiring removal   Gynecologic History Patient's last menstrual period was 01/27/2016 (approximate). Contraception: none  Obstetric History OB History  Gravida Para Term Preterm AB Living  2 1 1   1 1   SAB TAB Ectopic Multiple Live Births  1     0 1    # Outcome Date GA Lbr Len/2nd Weight Sex Delivery Anes PTL Lv  2 Term 11/27/14 [redacted]w[redacted]d 03:04 / 04:23 7 lb 2.6 oz (3.249 kg) F Vag-Spont Local  LIV  1 SAB               Past Medical History:  Diagnosis Date  . Acute blood loss anemia 11/28/2014  . Maternal iron deficiency anemia 11/28/2014  . Medical history non-contributory   . Postpartum care following vaginal delivery (9/18) 11/28/2014    Past Surgical History:  Procedure Laterality Date  . NO PAST SURGERIES      Current Outpatient Prescriptions on File Prior to Visit  Medication Sig Dispense Refill  . docusate sodium (COLACE) 100 MG capsule Take 1 capsule (100 mg total) by mouth 2 (two) times daily. 60 capsule 0  . ibuprofen (ADVIL,MOTRIN) 600 MG tablet Take 1 tablet (600 mg total) by mouth every 6 (six) hours. 30 tablet 0   No current facility-administered medications on file prior to visit.     No Known Allergies  Social History   Social History  . Marital status: Married    Spouse name: N/A  . Number of children: N/A  . Years of education: N/A   Occupational  History  . Not on file.   Social History Main Topics  . Smoking status: Never Smoker  . Smokeless tobacco: Never Used  . Alcohol use No  . Drug use: No  . Sexual activity: Yes    Birth control/ protection: None   Other Topics Concern  . Not on file   Social History Narrative  . No narrative on file    No family history on file.  The following portions of the patient's history were reviewed and updated as appropriate: allergies, current medications, past family history, past medical history, past social history, past surgical history and problem list.  Review of Systems Review of Systems - comprehensive review of systems is negative except that noted in the history of present illness  Objective:   BP 108/76   Pulse (!) 125   Ht 5\' 6"  (1.676 m)   Wt 178 lb 9.6 oz (81 kg)   LMP 01/27/2016 (Approximate)   Breastfeeding? No   BMI 28.83 kg/m  CONSTITUTIONAL: Well-developed, well-nourished female in no acute distress.  HENT:  Normocephalic, atraumatic. No identifiable increased facial hair growth today NECK: Normal range of motion, supple, no masses.  Normal thyroid.  SKIN: Skin is warm and dry. No rash noted. Not diaphoretic. No erythema. No pallor. NEUROLGIC: Alert and oriented to person, place, and time. PSYCHIATRIC: Normal mood and affect. Normal behavior.  Normal judgment and thought content. CARDIOVASCULAR:Not Examined RESPIRATORY: Not Examined BREASTS: No inframammary hair noted ABDOMEN: Soft, non distended; Non tender.  No Organomegaly. Normal female escutcheon PELVIC:  External Genitalia: Normal  BUS: Normal  Vagina: Normal  Cervix: Normal; no lesions; no discharge  Uterus: Normal size, shape,consistency, mobile, midplane, nontender  Adnexa: Normal; nonpalpable and nontender  RV: Normal external exam  Bladder: Nontender MUSCULOSKELETAL: Normal range of motion. No tenderness.  No cyanosis, clubbing, or edema.     Assessment:   1. Irregular menses - POCT  urine pregnancy - TSH - Prolactin - Testosterone, Free, Total, SHBG - US Pelvis Complete; Future - US Transvaginal Non-OB; Future  2. Amenorrhea - TSH - Prolactin - Testosterone, Free, Total, SHBG - US Pelvis Complete; Future - US Transvaginal Non-OB; Future  3. Pre-eclampsia, antepartum, history of  4. Overweight   The patient's infrequent irregular cycles may be related to polycystic ovary disease.  Plan:   1. Urine pregnancy test-negative 2. Provera 10 mg a day 10 days 3. Maintain menstrual calendar monitoring 4. Pelvic ultrasound 5. Return in 3 months for follow-up and further management planning 6. Patient understands that for maintain her irregular cycling, birth control pills will be recommended unless desiring to conceive, then Clomid therapy will be considered  A total of 30 minutes were spent face-to-face with the patient during the encounter with greater than 50% dealing with counseling and coordination of care.  Herold HarmsMartin A Nannie Starzyk, MD  Note: This dictation was prepared with Dragon dictation along with smaller phrase technology. Any transcriptional errors that result from this process are unintentional.

## 2016-03-15 ENCOUNTER — Other Ambulatory Visit: Payer: Medicaid Other

## 2016-03-16 LAB — TSH: TSH: 3.46 u[IU]/mL (ref 0.450–4.500)

## 2016-03-16 LAB — PROLACTIN: PROLACTIN: 7.2 ng/mL (ref 4.8–23.3)

## 2016-03-16 LAB — TESTOSTERONE, FREE, TOTAL, SHBG
Sex Hormone Binding: 33.7 nmol/L (ref 24.6–122.0)
TESTOSTERONE FREE: 3 pg/mL (ref 0.0–4.2)
Testosterone: 56 ng/dL — ABNORMAL HIGH (ref 8–48)

## 2016-03-21 ENCOUNTER — Encounter: Payer: Self-pay | Admitting: *Deleted

## 2016-03-21 ENCOUNTER — Emergency Department
Admission: EM | Admit: 2016-03-21 | Discharge: 2016-03-21 | Disposition: A | Discharge status: Home / Self Care | Payer: Medicaid Other | Attending: Emergency Medicine | Admitting: Emergency Medicine

## 2016-03-21 DIAGNOSIS — Z79899 Other long term (current) drug therapy: Secondary | ICD-10-CM | POA: Insufficient documentation

## 2016-03-21 DIAGNOSIS — H60392 Other infective otitis externa, left ear: Secondary | ICD-10-CM | POA: Diagnosis not present

## 2016-03-21 DIAGNOSIS — H9202 Otalgia, left ear: Secondary | ICD-10-CM | POA: Diagnosis present

## 2016-03-21 MED ORDER — IBUPROFEN 800 MG PO TABS: 800.0000 mg | ORAL_TABLET | Freq: Three times a day (TID) | ORAL | 0 refills | Status: DC | PRN

## 2016-03-21 MED ORDER — AMOXICILLIN-POT CLAVULANATE 875-125 MG PO TABS: 1.0000 | ORAL_TABLET | Freq: Two times a day (BID) | ORAL | 0 refills | Status: DC

## 2016-03-21 NOTE — ED Notes (Signed)
Pt given cup of water 

## 2016-03-21 NOTE — ED Provider Notes (Signed)
[facility] Emergency Department Provider Note  ____________________________________________  Time seen: Approximately 1:20 PM  I have reviewed the triage vital signs and the nursing notes.   HISTORY  Chief Complaint Otalgia    HPI Sheri Watkins is a 31 y.o. female , NAD, presents to the emergency department with several week history of left ear pain. Patient states she was seen in urgent care approximately 10 days ago for the same left ear pain. Was placed on eardrops in which she states has not alleviated her pain, swelling or discharge. Denies any injury or trauma to the face or neck. Has not had any nasal congestion, runny nose, sinus pressure, fevers, chills or body aches. States that the hearing seems to be in slightly muffled about the left ear. Also notes swelling about the left side of the neck that started after her ear pain. Has not noted any masses or other skin sores.   Past Medical History:  Diagnosis Date  . Acute blood loss anemia 11/28/2014  . Maternal iron deficiency anemia 11/28/2014  . Medical history non-contributory   . Postpartum care following vaginal delivery (9/18) 11/28/2014    Patient Active Problem List   Diagnosis Date Noted  . Overweight 03/14/2016  . Amenorrhea 03/14/2016  . Irregular menses 03/14/2016  . Maternal iron deficiency anemia 11/28/2014  . Acute blood loss anemia 11/28/2014  . Postpartum care following vaginal delivery (9/18) 11/28/2014  . Mild preeclampsia delivered 11/27/2014  . Elevated blood pressure complicating pregnancy in third trimester, antepartum 11/26/2014  . Pre-eclampsia, antepartum 11/26/2014  . Mild preeclampsia 11/26/2014    Past Surgical History:  Procedure Laterality Date  . NO PAST SURGERIES      Prior to Admission medications   Medication Sig Start Date End Date Taking? Authorizing Provider  amoxicillin-clavulanate (AUGMENTIN) 875-125 MG tablet Take 1 tablet by mouth 2 (two) times  daily. 03/21/16   Devann Cribb L Adrian Specht, PA-C  ibuprofen (ADVIL,MOTRIN) 800 MG tablet Take 1 tablet (800 mg total) by mouth every 8 (eight) hours as needed (pain). 03/21/16   Marieliz Strang L Ayanna Gheen, PA-C  medroxyPROGESTERone (PROVERA) 10 MG tablet Take 1 tablet (10 mg total) by mouth daily. 03/14/16   Herold Harms, MD    Allergies Patient has no known allergies.  History reviewed. No pertinent family history.  Social History Social History  Substance Use Topics  . Smoking status: Never Smoker  . Smokeless tobacco: Never Used  . Alcohol use No     Review of Systems  Constitutional: No fever/chills ENT: Positive left ear pain, drainage and swelling. No sore throat or nasal congestion, sinus pressure, tinnitus, hearing loss. Cardiovascular: No chest pain. Respiratory: No cough or chest congestion. No shortness of breath. Musculoskeletal: Negative for general myalgias.  Skin: Positive skin sore left ear, swelling left ear and left neck. Negative for rash, redness, abnormal warmth. Neurological: Negative for headaches, focal weakness or numbness. 10-point ROS otherwise negative.  ____________________________________________   PHYSICAL EXAM:  VITAL SIGNS: ED Triage Vitals [03/21/16 1231]  Enc Vitals Group     BP 122/69     Pulse Rate 97     Resp 18     Temp 98.3 F (36.8 C)     Temp Source Oral     SpO2 100 %     Weight 178 lb (80.7 kg)     Height 5\' 6"  (1.676 m)     Head Circumference      Peak Flow      Pain Score  5     Pain Loc      Pain Edu?      Excl. in GC?      Constitutional: Alert and oriented. Well appearing and in no acute distress. Eyes: Conjunctivae are normal.  Head: Atraumatic. ENT:      Ears: Right TM and ear canal visualized without erythema, swelling, bulging, effusion or perforation. Left TM visualized without erythema, bulging but mild effusion is noted. Pain with manipulation of the left tragus and pinna. No mastoid tenderness or swelling. 2 mm annular  scabbed a skin sore noted about the entrance of the left ear canal with tenderness to palpation. Left ear canal does have mild swelling and yellow discharge.      Nose: No congestion/rhinnorhea.      Mouth/Throat: Mucous membranes are moist. Pharynx without erythema, swelling, exudate. Airway is patent. Uvula is midline. Neck: No stridor. Supple with full range of motion. No meningismus. Hematological/Lymphatic/Immunilogical: No cervical lymphadenopathy but patient notes tenderness to palpation about the left anterior cervical chain. Cardiovascular: Normal rate, regular rhythm. Normal S1 and S2.  Good peripheral circulation. Respiratory: Normal respiratory effort without tachypnea or retractions. Lungs CTAB with breath sounds noted in all lung fields. No wheeze, rhonchi, rales Neurologic:  Normal speech and language. No gross focal neurologic deficits are appreciated.  Skin:  Skin is warm, dry and intact. No rash or redness, abnormal warmth noted. Psychiatric: Mood and affect are normal. Speech and behavior are normal. Patient exhibits appropriate insight and judgement.   ____________________________________________   LABS  None ____________________________________________  EKG  None ____________________________________________  RADIOLOGY  None ____________________________________________    PROCEDURES  Procedure(s) performed: None   Procedures   Medications - No data to display   ____________________________________________   INITIAL IMPRESSION / ASSESSMENT AND PLAN / ED COURSE  Pertinent labs & imaging results that were available during my care of the patient were reviewed by me and considered in my medical decision making (see chart for details).  Clinical Course     Patient's diagnosis is consistent with Infective acute otitis externa of the left ear. Patient will be discharged home with prescriptions for Augmentin and ibuprofen to take as directed. Patient is  to follow up with Dr. Andee PolesVaught in ENT in 4 days if symptoms persist past this treatment course. Patient is given ED precautions to return to the ED for any worsening or new symptoms.   ____________________________________________  FINAL CLINICAL IMPRESSION(S) / ED DIAGNOSES  Final diagnoses:  Other infective acute otitis externa of left ear      NEW MEDICATIONS STARTED DURING THIS VISIT:  Discharge Medication List as of 03/21/2016  1:28 PM    START taking these medications   Details  amoxicillin-clavulanate (AUGMENTIN) 875-125 MG tablet Take 1 tablet by mouth 2 (two) times daily., Starting Thu 03/21/2016, Print             Hope PigeonJami L Bassy Fetterly, PA-C 03/21/16 1438    Nita Sicklearolina Veronese, MD 03/21/16 1455

## 2016-03-21 NOTE — ED Triage Notes (Signed)
States left ear pain for 2 months, states no relief from ear drops, awake and alert

## 2016-03-21 NOTE — ED Notes (Signed)
See triage note  States she has had ear pain for several weeks   Has been seen times 2 for same  But conts to have severe pain to left ear

## 2016-04-08 DIAGNOSIS — H6062 Unspecified chronic otitis externa, left ear: Secondary | ICD-10-CM | POA: Diagnosis not present

## 2016-06-12 ENCOUNTER — Ambulatory Visit (INDEPENDENT_AMBULATORY_CARE_PROVIDER_SITE_OTHER): Payer: Medicaid Other | Admitting: Obstetrics and Gynecology

## 2016-06-12 ENCOUNTER — Encounter: Payer: Self-pay | Admitting: Obstetrics and Gynecology

## 2016-06-12 VITALS — BP 133/83 | HR 116 | Ht 66.0 in | Wt 171.2 lb

## 2016-06-12 DIAGNOSIS — E282 Polycystic ovarian syndrome: Secondary | ICD-10-CM

## 2016-06-12 DIAGNOSIS — N926 Irregular menstruation, unspecified: Secondary | ICD-10-CM | POA: Diagnosis not present

## 2016-06-12 NOTE — Patient Instructions (Signed)
1. Ultrasound is scheduled for evaluation of the pelvis and irregular menstrual cycles 2. Maintain menstrual calendar monitoring for assessment of irregular menstrual cycles 3. Return in 3 months for follow-up and consideration of possible Clomid therapy for attempts at conception

## 2016-06-12 NOTE — Progress Notes (Signed)
Chief complaint: 1. Irregular menstrual cycles 2. Possible PCO  Patient presents for follow-up. She has suspected PCO and has had irregular menstrual cycles. Laboratory survey including prolactin level, TSH, testosterone profile were all normal except for total testosterone which was mildly elevated. An ultrasound was to be obtained, but was not completed as of this date. Menstrual calendar monitoring demonstrated to regular cycles in February and March approximately 28 days apart; she just began her current menstrual cycle, again approximately 28 days from her last period.  Past medical history, past surgical history, problem list, medications, and allergies are reviewed  OBJECTIVE: BP 133/83   Pulse (!) 116   Ht  (1.676 m)   Wt 171 lb 3.2 oz (77.7 kg)   LMP 06/11/2016 (Exact Date)   Breastfeeding? No   BMI 27.63 kg/m  Physical exam-deferred  ASSESSMENT: 1. History of irregular menstrual cycles 2. 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 Menstrual calendar monitoring demonstrates possible ovulatory cycles at this time over the past 3 months 4. Laboratory survey was normal except for a slightly elevated total testosterone 5. Pelvic ultrasound has not been obtained.  PLAN: 1. Pelvic ultrasound 2. Maintain menstrual calendar monitoring 3. Return in 3 months for follow-up regarding future conception planning  A total of 15 minutes were spent face-to-face with the patient during this encounter and over half of that time dealt with counseling and coordination of care.  Herold Harms, MD  Note: This dictation was prepared with Dragon dictation along with smaller phrase technology. Any transcriptional errors that result from this process are unintentional.

## 2016-06-13 ENCOUNTER — Other Ambulatory Visit: Payer: Medicaid Other

## 2016-06-17 ENCOUNTER — Ambulatory Visit (INDEPENDENT_AMBULATORY_CARE_PROVIDER_SITE_OTHER): Payer: Medicaid Other

## 2016-06-17 DIAGNOSIS — N912 Amenorrhea, unspecified: Secondary | ICD-10-CM

## 2016-06-17 DIAGNOSIS — N926 Irregular menstruation, unspecified: Secondary | ICD-10-CM | POA: Diagnosis not present

## 2016-09-12 ENCOUNTER — Encounter: Payer: Medicaid Other | Admitting: Obstetrics and Gynecology

## 2016-09-17 ENCOUNTER — Encounter: Payer: Medicaid Other | Admitting: Obstetrics and Gynecology

## 2016-10-01 ENCOUNTER — Encounter: Payer: Medicaid Other | Admitting: Obstetrics and Gynecology

## 2016-11-05 ENCOUNTER — Ambulatory Visit (INDEPENDENT_AMBULATORY_CARE_PROVIDER_SITE_OTHER): Payer: Medicaid Other | Admitting: Obstetrics and Gynecology

## 2016-11-05 ENCOUNTER — Encounter: Payer: Self-pay | Admitting: Obstetrics and Gynecology

## 2016-11-05 VITALS — BP 91/60 | HR 91 | Ht 66.0 in | Wt 162.4 lb

## 2016-11-05 DIAGNOSIS — E282 Polycystic ovarian syndrome: Secondary | ICD-10-CM | POA: Diagnosis not present

## 2016-11-05 DIAGNOSIS — N926 Irregular menstruation, unspecified: Secondary | ICD-10-CM

## 2016-11-05 DIAGNOSIS — N912 Amenorrhea, unspecified: Secondary | ICD-10-CM | POA: Diagnosis not present

## 2016-11-05 LAB — POCT URINE PREGNANCY: PREG TEST UR: POSITIVE — AB

## 2016-11-05 NOTE — Progress Notes (Signed)
HPI:      [name] is a 31 y.o. G3P1011 who LMP was Patient's last menstrual period was 09/06/2016 (exact date).  Subjective:   She presents today Bleeding that she may be pregnant. She has a history of PCO and irregular menstrual cycles. She recently began having cycles again and then skipped a cycle and she believes she is pregnant. She has begun prenatal vitamins. She states that her last Pregnancy was complicated by hypertension upon admission to labor and delivery.    Hx: The following portions of the patient's history were reviewed and updated as appropriate:             She  has a past medical history of Acute blood loss anemia (11/28/2014); Maternal iron deficiency anemia (11/28/2014); Medical history non-contributory; and Postpartum care following vaginal delivery (9/18) (11/28/2014). She  does not have any pertinent problems on file. She  has a past surgical history that includes No past surgeries and Dilation and curettage of uterus. Her family history is not on file. She  reports that she has never smoked. She has never used smokeless tobacco. She reports that she does not drink alcohol or use drugs. She has No Known Allergies.       Review of Systems:  Review of Systems  Constitutional: Denied constitutional symptoms, night sweats, recent illness, fatigue, fever, insomnia and weight loss.  Eyes: Denied eye symptoms, eye pain, photophobia, vision change and visual disturbance.  Ears/Nose/Throat/Neck: Denied ear, nose, throat or neck symptoms, hearing loss, nasal discharge, sinus congestion and sore throat.  Cardiovascular: Denied cardiovascular symptoms, arrhythmia, chest pain/pressure, edema, exercise intolerance, orthopnea and palpitations.  Respiratory: Denied pulmonary symptoms, asthma, pleuritic pain, productive sputum, cough, dyspnea and wheezing.  Gastrointestinal: Denied, gastro-esophageal reflux, melena, nausea and vomiting.  Genitourinary: Denied genitourinary  symptoms including symptomatic vaginal discharge, pelvic relaxation issues, and urinary complaints.  Musculoskeletal: Denied musculoskeletal symptoms, stiffness, swelling, muscle weakness and myalgia.  Dermatologic: Denied dermatology symptoms, rash and scar.  Neurologic: Denied neurology symptoms, dizziness, headache, neck pain and syncope.  Psychiatric: Denied psychiatric symptoms, anxiety and depression.  Endocrine: Denied endocrine symptoms including hot flashes and night sweats.   Meds:   Current Outpatient Prescriptions on File Prior to Visit  Medication Sig Dispense Refill  . Fe Bisgly-Succ-C-Thre-B12-FA (IRON-150 PO) Take by mouth.    . Prenatal Vit-Fe Fumarate-FA (MULTIVITAMIN-PRENATAL) 27-0.8 MG TABS tablet Take 1 tablet by mouth daily at 12 noon.     No current facility-administered medications on file prior to visit.     Objective:     Vitals:   11/05/16 1453  BP: 91/60  Pulse: 91              Positive urinary beta hCG  Assessment:    G3P1011 Patient Active Problem List   Diagnosis Date Noted  . Overweight 03/14/2016  . Amenorrhea 03/14/2016  . Irregular menses 03/14/2016  . Maternal iron deficiency anemia 11/28/2014  . Acute blood loss anemia 11/28/2014  . Postpartum care following vaginal delivery (9/18) 11/28/2014  . Mild preeclampsia delivered 11/27/2014  . Elevated blood pressure complicating pregnancy in third trimester, antepartum 11/26/2014  . Pre-eclampsia, antepartum 11/26/2014  . Mild preeclampsia 11/26/2014     1. Amenorrhea   2. PCO (polycystic ovaries)   3. Irregular menses     Possible history of pregnancy-induced hypertension or preeclampsia.   Plan:            Prenatal Plan 1.  The patient was given prenatal literature. 2.  She was continued on prenatal vitamins. 3.  A prenatal lab panel was ordered or drawn. 4.  An ultrasound was ordered to better determine an EDC. 5.  A nurse visit was scheduled. 6.  We will obtain old  records from [facility] in [place] to determine history of preeclampsia. Consider baby aspirin daily if appropriate.  Orders Orders Placed This Encounter  Procedures  . US OB Comp Less 14 Wks  . POCT urine pregnancy    No orders of the defined types were placed in this encounter.       F/U  Return in about 4 weeks (around 12/03/2016).  Elonda Husky, M.D. 11/05/2016 3:25 PM

## 2016-11-07 ENCOUNTER — Emergency Department
Admission: EM | Admit: 2016-11-07 | Discharge: 2016-11-07 | Disposition: A | Discharge status: Home / Self Care | Payer: Medicaid Other | Attending: Student in an Organized Health Care Education/Training Program | Admitting: Student in an Organized Health Care Education/Training Program

## 2016-11-07 ENCOUNTER — Encounter: Payer: Self-pay | Admitting: Emergency Medicine

## 2016-11-07 ENCOUNTER — Emergency Department: Payer: Medicaid Other

## 2016-11-07 DIAGNOSIS — O200 Threatened abortion: Secondary | ICD-10-CM | POA: Diagnosis not present

## 2016-11-07 DIAGNOSIS — O209 Hemorrhage in early pregnancy, unspecified: Secondary | ICD-10-CM | POA: Diagnosis present

## 2016-11-07 DIAGNOSIS — Z79899 Other long term (current) drug therapy: Secondary | ICD-10-CM | POA: Diagnosis not present

## 2016-11-07 DIAGNOSIS — Z3A08 8 weeks gestation of pregnancy: Secondary | ICD-10-CM | POA: Diagnosis not present

## 2016-11-07 DIAGNOSIS — O4690 Antepartum hemorrhage, unspecified, unspecified trimester: Secondary | ICD-10-CM

## 2016-11-07 LAB — CBC
HEMATOCRIT: 37.7 % (ref 35.0–47.0)
Hemoglobin: 13.4 g/dL (ref 12.0–16.0)
MCH: 30.2 pg (ref 26.0–34.0)
MCHC: 35.4 g/dL (ref 32.0–36.0)
MCV: 85.1 fL (ref 80.0–100.0)
Platelets: 271 10*3/uL (ref 150–440)
RBC: 4.43 MIL/uL (ref 3.80–5.20)
RDW: 13.2 % (ref 11.5–14.5)
WBC: 9.6 10*3/uL (ref 3.6–11.0)

## 2016-11-07 LAB — BASIC METABOLIC PANEL
ANION GAP: 8 (ref 5–15)
BUN: 8 mg/dL (ref 6–20)
CALCIUM: 10.4 mg/dL — AB (ref 8.9–10.3)
CO2: 22 mmol/L (ref 22–32)
Chloride: 105 mmol/L (ref 101–111)
Creatinine, Ser: 0.44 mg/dL (ref 0.44–1.00)
Glucose, Bld: 95 mg/dL (ref 65–99)
POTASSIUM: 4.3 mmol/L (ref 3.5–5.1)
Sodium: 135 mmol/L (ref 135–145)

## 2016-11-07 LAB — URINALYSIS, COMPLETE (UACMP) WITH MICROSCOPIC
BILIRUBIN URINE: NEGATIVE
Bacteria, UA: NONE SEEN
GLUCOSE, UA: NEGATIVE mg/dL
KETONES UR: NEGATIVE mg/dL
LEUKOCYTES UA: NEGATIVE
NITRITE: NEGATIVE
PH: 7 (ref 5.0–8.0)
Protein, ur: NEGATIVE mg/dL
Specific Gravity, Urine: 1.001 — ABNORMAL LOW (ref 1.005–1.030)

## 2016-11-07 LAB — HCG, QUANTITATIVE, PREGNANCY: hCG, Beta Chain, Quant, S: 12209 m[IU]/mL — ABNORMAL HIGH (ref <5)

## 2016-11-07 MED ORDER — ACETAMINOPHEN 325 MG PO TABS
650.0000 mg | ORAL_TABLET | Freq: Once | ORAL | Status: AC
  Administered 2016-11-07: 650 mg via ORAL
  Filled 2016-11-07: qty 2

## 2016-11-07 NOTE — ED Provider Notes (Signed)
[facility] Emergency Department Provider Note    First MD Initiated Contact with Patient 11/07/16 1912     (approximate)  I have reviewed the triage vital signs and the nursing notes.   HISTORY  Chief Complaint Vaginal Bleeding    HPI Sheri Watkins is a 31 y.o. female who is roughly [redacted] weeks pregnant by last menstrual cycle presents with chief complaint of left lower quadrant abdominal pain and potting that started tonight around 5pm.  Does have a history of miscarriage.  No h/o bleeding disorders. No fevers. No discharge.  Denies any chest pain or shortness of breath.   Past Medical History:  Diagnosis Date  . Acute blood loss anemia 11/28/2014  . Maternal iron deficiency anemia 11/28/2014  . Medical history non-contributory   . Postpartum care following vaginal delivery (9/18) 11/28/2014   Family History  Problem Relation Age of Onset  . Breast cancer Neg Hx   . Ovarian cancer Neg Hx   . Diabetes Neg Hx   . Heart disease Neg Hx    Past Surgical History:  Procedure Laterality Date  . DILATION AND CURETTAGE OF UTERUS    . NO PAST SURGERIES     Patient Active Problem List   Diagnosis Date Noted  . Overweight 03/14/2016  . Amenorrhea 03/14/2016  . Irregular menses 03/14/2016  . Maternal iron deficiency anemia 11/28/2014  . Acute blood loss anemia 11/28/2014  . Postpartum care following vaginal delivery (9/18) 11/28/2014  . Mild preeclampsia delivered 11/27/2014  . Elevated blood pressure complicating pregnancy in third trimester, antepartum 11/26/2014  . Pre-eclampsia, antepartum 11/26/2014  . Mild preeclampsia 11/26/2014      Prior to Admission medications   Medication Sig Start Date End Date Taking? Authorizing Provider  Fe Bisgly-Succ-C-Thre-B12-FA (IRON-150 PO) Take by mouth.    [provider]  Prenatal Vit-Fe Fumarate-FA (MULTIVITAMIN-PRENATAL) 27-0.8 MG TABS tablet Take 1 tablet by mouth daily at 12 noon.    [provider]    Allergies Patient has no known allergies.    Social History Social History  Substance Use Topics  . Smoking status: Never Smoker  . Smokeless tobacco: Never Used  . Alcohol use No    Review of Systems Patient denies headaches, rhinorrhea, blurry vision, numbness, shortness of breath, chest pain, edema, cough, abdominal pain, nausea, vomiting, diarrhea, dysuria, fevers, rashes or hallucinations unless otherwise stated above in HPI. ____________________________________________   PHYSICAL EXAM:  VITAL SIGNS: Vitals:   11/07/16 1808  BP: 121/66  Pulse: 85  Resp: 18  Temp: 97.9 F (36.6 C)  SpO2: 100%    Constitutional: Alert and oriented. Well appearing and in no acute distress. Eyes: Conjunctivae are normal.  Head: Atraumatic. Nose: No congestion/rhinnorhea. Mouth/Throat: Mucous membranes are moist.   Neck: No stridor. Painless ROM.  Cardiovascular: Normal rate, regular rhythm. Grossly normal heart sounds.  Good peripheral circulation. Respiratory: Normal respiratory effort.  No retractions. Lungs CTAB. Gastrointestinal: Soft and nontender. No distention. No abdominal bruits. No CVA tenderness. Genitourinary:  Musculoskeletal: No lower extremity tenderness nor edema.  No joint effusions. Neurologic:  Normal speech and language. No gross focal neurologic deficits are appreciated. No facial droop Skin:  Skin is warm, dry and intact. No rash noted. Psychiatric: Mood and affect are normal. Speech and behavior are normal.  ____________________________________________   LABS (all labs ordered are listed, but only abnormal results are displayed)  Results for orders placed or performed during the hospital encounter of 11/07/16 (from the past 24  hour(s))  hCG, quantitative, pregnancy     Status: Abnormal   Collection Time: 11/07/16  6:08 PM  Result Value Ref Range   hCG, Beta Chain, Quant, S 12,209 (H) <5 mIU/mL  CBC     Status: None   Collection  Time: 11/07/16  6:08 PM  Result Value Ref Range   WBC 9.6 3.6 - 11.0 K/uL   RBC 4.43 3.80 - 5.20 MIL/uL   Hemoglobin 13.4 12.0 - 16.0 g/dL   HCT 32.437.7 40.135.0 - 02.747.0 %   MCV 85.1 80.0 - 100.0 fL   MCH 30.2 26.0 - 34.0 pg   MCHC 35.4 32.0 - 36.0 g/dL   RDW 25.313.2 66.411.5 - 40.314.5 %   Platelets 271 150 - 440 K/uL  Basic metabolic panel     Status: Abnormal   Collection Time: 11/07/16  6:08 PM  Result Value Ref Range   Sodium 135 135 - 145 mmol/L   Potassium 4.3 3.5 - 5.1 mmol/L   Chloride 105 101 - 111 mmol/L   CO2 22 22 - 32 mmol/L   Glucose, Bld 95 65 - 99 mg/dL   BUN 8 6 - 20 mg/dL   Creatinine, Ser 4.740.44 0.44 - 1.00 mg/dL   Calcium 25.910.4 (H) 8.9 - 10.3 mg/dL   GFR calc non Af Amer >60 >60 mL/min   GFR calc Af Amer >60 >60 mL/min   Anion gap 8 5 - 15  Urinalysis, Complete w Microscopic     Status: Abnormal   Collection Time: 11/07/16  6:14 PM  Result Value Ref Range   Color, Urine COLORLESS (A) YELLOW   APPearance CLEAR (A) CLEAR   Specific Gravity, Urine 1.001 (L) 1.005 - 1.030   pH 7.0 5.0 - 8.0   Glucose, UA NEGATIVE NEGATIVE mg/dL   Hgb urine dipstick LARGE (A) NEGATIVE   Bilirubin Urine NEGATIVE NEGATIVE   Ketones, ur NEGATIVE NEGATIVE mg/dL   Protein, ur NEGATIVE NEGATIVE mg/dL   Nitrite NEGATIVE NEGATIVE   Leukocytes, UA NEGATIVE NEGATIVE   RBC / HPF 0-5 0 - 5 RBC/hpf   WBC, UA 0-5 0 - 5 WBC/hpf   Bacteria, UA NONE SEEN NONE SEEN   Squamous Epithelial / LPF 0-5 (A) NONE SEEN   ____________________________________________  ____________________________________________  RADIOLOGY  I personally reviewed all radiographic images ordered to evaluate for the above acute complaints and reviewed radiology reports and findings.  These findings were personally discussed with the patient.  Please see medical record for radiology report.  ____________________________________________   PROCEDURES  Procedure(s) performed:  Procedures    Critical Care performed:  no ____________________________________________   INITIAL IMPRESSION / ASSESSMENT AND PLAN / ED COURSE  Pertinent labs & imaging results that were available during my care of the patient were reviewed by me and considered in my medical decision making (see chart for details).  DDX: ectopic, threatened ab, placenta previa, abruption  Sheri Watkins is a 31 y.o. who presents to the ED with Vaginal spotting in early pregnancy. Patient is well-appearing and in no acute distress. Abdominal exam soft and benign. Ultrasound ordered to evaluate for the above differential. There is no evidence of ectopic at this time.  Still very early gestational sac on ultrasound. No evidence of cardiac activity. Discussed concern for threatened miscarriage with patient and husband at bedside.They have follow-up with OB/GYN and her stable for further workup as an outpatient.      ____________________________________________   FINAL CLINICAL IMPRESSION(S) / ED DIAGNOSES  Final diagnoses:  Vaginal  bleeding in pregnancy  Threatened miscarriage      NEW MEDICATIONS STARTED DURING THIS VISIT:  New Prescriptions   No medications on file     Note:  This document was prepared using Dragon voice recognition software and may include unintentional dictation errors.    Willy Eddy, MD 11/07/16 2202

## 2016-11-07 NOTE — ED Triage Notes (Signed)
Pt reports vaginal bleeding that began today. Pt denies needing to change more than one pad an hour. Pt reports seeing bright red blood on tissue when she wipes. Pt reports some mild abdominal cramping. Pt reports she is approximately [redacted] weeks pregnant.

## 2016-11-07 NOTE — ED Notes (Signed)
Patient transported to Ultrasound 

## 2016-11-12 ENCOUNTER — Ambulatory Visit (INDEPENDENT_AMBULATORY_CARE_PROVIDER_SITE_OTHER): Payer: Medicaid Other | Admitting: Obstetrics and Gynecology

## 2016-11-12 ENCOUNTER — Encounter: Payer: Self-pay | Admitting: Obstetrics and Gynecology

## 2016-11-12 VITALS — BP 99/63 | HR 89 | Wt 166.5 lb

## 2016-11-12 DIAGNOSIS — O200 Threatened abortion: Secondary | ICD-10-CM | POA: Diagnosis not present

## 2016-11-12 NOTE — Progress Notes (Signed)
HPI:      [name] is a 31 y.o. G3P1011 who LMP was Patient's last menstrual period was 09/06/2016 (exact date).  Subjective:   She presents today After being seen in the emergency department for some spotting/vaginal bleeding on the 30th. She continues to have a very small amount of spotting when she wipes. She denies cramping or passage of clots or tissue. An ultrasound performed in the emergency department revealed intrauterine pregnancy with yolk sac without fetal pole.    Hx: The following portions of the patient's history were reviewed and updated as appropriate:             She  has a past medical history of Acute blood loss anemia (11/28/2014); Maternal iron deficiency anemia (11/28/2014); Medical history non-contributory; and Postpartum care following vaginal delivery (9/18) (11/28/2014). She  does not have any pertinent problems on file. She  has a past surgical history that includes No past surgeries and Dilation and curettage of uterus. Her family history is not on file. She  reports that she has never smoked. She has never used smokeless tobacco. She reports that she does not drink alcohol or use drugs. She has No Known Allergies.       Review of Systems:  Review of Systems  Constitutional: Denied constitutional symptoms, night sweats, recent illness, fatigue, fever, insomnia and weight loss.  Eyes: Denied eye symptoms, eye pain, photophobia, vision change and visual disturbance.  Ears/Nose/Throat/Neck: Denied ear, nose, throat or neck symptoms, hearing loss, nasal discharge, sinus congestion and sore throat.  Cardiovascular: Denied cardiovascular symptoms, arrhythmia, chest pain/pressure, edema, exercise intolerance, orthopnea and palpitations.  Respiratory: Denied pulmonary symptoms, asthma, pleuritic pain, productive sputum, cough, dyspnea and wheezing.  Gastrointestinal: Denied, gastro-esophageal reflux, melena, nausea and vomiting.  Genitourinary: See HPI for  additional information.  Musculoskeletal: Denied musculoskeletal symptoms, stiffness, swelling, muscle weakness and myalgia.  Dermatologic: Denied dermatology symptoms, rash and scar.  Neurologic: Denied neurology symptoms, dizziness, headache, neck pain and syncope.  Psychiatric: Denied psychiatric symptoms, anxiety and depression.  Endocrine: Denied endocrine symptoms including hot flashes and night sweats.   Meds:   Current Outpatient Prescriptions on File Prior to Visit  Medication Sig Dispense Refill  . Fe Bisgly-Succ-C-Thre-B12-FA (IRON-150 PO) Take by mouth.    . Prenatal Vit-Fe Fumarate-FA (MULTIVITAMIN-PRENATAL) 27-0.8 MG TABS tablet Take 1 tablet by mouth daily at 12 noon.     No current facility-administered medications on file prior to visit.     Objective:     Vitals:   11/12/16 1111  BP: 99/63  Pulse: 89              Ultrasound results of the emergency department reviewed in detail directly with the patient.  Assessment:    G3P1011 Patient Active Problem List   Diagnosis Date Noted  . Overweight 03/14/2016  . Amenorrhea 03/14/2016  . Irregular menses 03/14/2016  . Maternal iron deficiency anemia 11/28/2014  . Acute blood loss anemia 11/28/2014  . Postpartum care following vaginal delivery (9/18) 11/28/2014  . Mild preeclampsia delivered 11/27/2014  . Elevated blood pressure complicating pregnancy in third trimester, antepartum 11/26/2014  . Pre-eclampsia, antepartum 11/26/2014  . Mild preeclampsia 11/26/2014     1. Threatened abortion     Possibly too early in pregnancy to identify fetal pole.     Plan:            1.  Follow-up pelvic ultrasound on Thursday. Expect presence of fetal pole.  SAb I have discussed the  possibility of miscarriage with the patient.  I have informed her that vaginal bleeding, cramping or passage of tissue are the most common signs of miscarriage.  Should she have heavy bleeding, pass tissue or have other problems, she has  been informed to call the office immediately.  I have advised her to remain at pelvic rest for at least one week after her last episode of bleeding.  If she is currently working, I have instructed her to discontinue until this situation resolves.  Complete versus incomplete miscarriage was discussed and the patient is aware that should she develop heavy bleeding, fever, or persistent crampy pelvic pain she may require a D & E to remove the remaining portion of the miscarried pregnancy.  I advised her to keep me informed should her condition change.  Orders No orders of the defined types were placed in this encounter.   No orders of the defined types were placed in this encounter.     F/U  Return in about 2 days (around 11/14/2016).  Elonda Huskyavid J. Evans, M.D. 11/12/2016 11:34 AM

## 2016-11-14 ENCOUNTER — Ambulatory Visit
Admission: RE | Admit: 2016-11-14 | Discharge: 2016-11-14 | Disposition: A | Discharge status: Home / Self Care | Payer: Medicaid Other | Source: Ambulatory Visit | Attending: Obstetrics and Gynecology | Admitting: Obstetrics and Gynecology

## 2016-11-14 ENCOUNTER — Telehealth: Payer: Self-pay

## 2016-11-14 DIAGNOSIS — Z3687 Encounter for antenatal screening for uncertain dates: Secondary | ICD-10-CM | POA: Diagnosis present

## 2016-11-14 DIAGNOSIS — N912 Amenorrhea, unspecified: Secondary | ICD-10-CM

## 2016-11-14 DIAGNOSIS — Z3A01 Less than 8 weeks gestation of pregnancy: Secondary | ICD-10-CM | POA: Insufficient documentation

## 2016-11-14 DIAGNOSIS — E282 Polycystic ovarian syndrome: Secondary | ICD-10-CM

## 2016-11-14 DIAGNOSIS — O200 Threatened abortion: Secondary | ICD-10-CM | POA: Diagnosis not present

## 2016-11-14 DIAGNOSIS — N926 Irregular menstruation, unspecified: Secondary | ICD-10-CM

## 2016-11-14 NOTE — Telephone Encounter (Signed)
Mallory called from U/S needing an order for Transvaginal U/S.

## 2016-11-18 ENCOUNTER — Ambulatory Visit (INDEPENDENT_AMBULATORY_CARE_PROVIDER_SITE_OTHER): Payer: Medicaid Other | Admitting: Obstetrics and Gynecology

## 2016-11-18 ENCOUNTER — Encounter: Payer: Self-pay | Admitting: Obstetrics and Gynecology

## 2016-11-18 VITALS — BP 101/63 | HR 84 | Wt 165.4 lb

## 2016-11-18 DIAGNOSIS — O200 Threatened abortion: Secondary | ICD-10-CM | POA: Diagnosis not present

## 2016-11-18 NOTE — Progress Notes (Signed)
HPI:      [name] is a 31 y.o. G3P1011 who LMP was Patient's last menstrual period was 09/06/2016 (exact date).  Subjective:   She presents today For follow-up of her ultrasound and continued vaginal spotting. She states that she has DOB brown discharge but denies bleeding. Denies cramping. She is taking her prenatal vitamins. She does complain of bowel gas but she thinks this may have to do with her diet.    Hx: The following portions of the patient's history were reviewed and updated as appropriate:             She  has a past medical history of Acute blood loss anemia (11/28/2014); Maternal iron deficiency anemia (11/28/2014); Medical history non-contributory; and Postpartum care following vaginal delivery (9/18) (11/28/2014). She  does not have any pertinent problems on file. She  has a past surgical history that includes No past surgeries and Dilation and curettage of uterus. Her family history is not on file. She  reports that she has never smoked. She has never used smokeless tobacco. She reports that she does not drink alcohol or use drugs. She has No Known Allergies.       Review of Systems:  Review of Systems  Constitutional: Denied constitutional symptoms, night sweats, recent illness, fatigue, fever, insomnia and weight loss.  Eyes: Denied eye symptoms, eye pain, photophobia, vision change and visual disturbance.  Ears/Nose/Throat/Neck: Denied ear, nose, throat or neck symptoms, hearing loss, nasal discharge, sinus congestion and sore throat.  Cardiovascular: Denied cardiovascular symptoms, arrhythmia, chest pain/pressure, edema, exercise intolerance, orthopnea and palpitations.  Respiratory: Denied pulmonary symptoms, asthma, pleuritic pain, productive sputum, cough, dyspnea and wheezing.  Gastrointestinal: Denied, gastro-esophageal reflux, melena, nausea and vomiting.  Genitourinary: Denied genitourinary symptoms including symptomatic vaginal discharge, pelvic  relaxation issues, and urinary complaints.  Musculoskeletal: Denied musculoskeletal symptoms, stiffness, swelling, muscle weakness and myalgia.  Dermatologic: Denied dermatology symptoms, rash and scar.  Neurologic: Denied neurology symptoms, dizziness, headache, neck pain and syncope.  Psychiatric: Denied psychiatric symptoms, anxiety and depression.  Endocrine: Denied endocrine symptoms including hot flashes and night sweats.   Meds:   Current Outpatient Prescriptions on File Prior to Visit  Medication Sig Dispense Refill  . Fe Bisgly-Succ-C-Thre-B12-FA (IRON-150 PO) Take by mouth.    . Prenatal Vit-Fe Fumarate-FA (MULTIVITAMIN-PRENATAL) 27-0.8 MG TABS tablet Take 1 tablet by mouth daily at 12 noon.     No current facility-administered medications on file prior to visit.     Objective:     Vitals:   11/18/16 0938  BP: 101/63  Pulse: 84              Ultrasound results reviewed directly with the patient. EDD discussed in detail  Assessment:    G3P1011 Patient Active Problem List   Diagnosis Date Noted  . Overweight 03/14/2016  . Amenorrhea 03/14/2016  . Irregular menses 03/14/2016  . Maternal iron deficiency anemia 11/28/2014  . Acute blood loss anemia 11/28/2014  . Postpartum care following vaginal delivery (9/18) 11/28/2014  . Mild preeclampsia delivered 11/27/2014  . Elevated blood pressure complicating pregnancy in third trimester, antepartum 11/26/2014  . Pre-eclampsia, antepartum 11/26/2014  . Mild preeclampsia 11/26/2014     1. Threatened abortion     Ultrasound revealing a crown-rump length and fetal heart tones.  Minimal brown discharge.   Plan:            1.  SAb I have discussed the possibility of miscarriage with the patient.  I have informed  her that vaginal bleeding, cramping or passage of tissue are the most common signs of miscarriage.  Should she have heavy bleeding, pass tissue or have other problems, she has been informed to call the office  immediately.  I have advised her to remain at pelvic rest for at least one week after her last episode of bleeding.  If she is currently working, I have instructed her to discontinue until this situation resolves.  Complete versus incomplete miscarriage was discussed and the patient is aware that should she develop heavy bleeding, fever, or persistent crampy pelvic pain she may require a D & E to remove the remaining portion of the miscarried pregnancy.  I advised her to keep me informed should her condition change. 2.  Discussed simethicone use.    No orders of the defined types were placed in this encounter.   No orders of the defined types were placed in this encounter.     F/U  Return for As scheduled. I spent 15 minutes with this patient of which greater than 50% was spent discussing reviewing her ultrasounds, EDD, spotting during pregnancy, risk of miscarriage, use of simethicone.   Elonda Husky, M.D. 11/18/2016 10:23 AM

## 2016-11-19 ENCOUNTER — Other Ambulatory Visit: Payer: Medicaid Other

## 2016-11-25 ENCOUNTER — Ambulatory Visit (INDEPENDENT_AMBULATORY_CARE_PROVIDER_SITE_OTHER): Payer: Medicaid Other | Admitting: Obstetrics and Gynecology

## 2016-11-25 VITALS — BP 110/69 | HR 96 | Ht 66.0 in | Wt 165.9 lb

## 2016-11-25 DIAGNOSIS — Z3481 Encounter for supervision of other normal pregnancy, first trimester: Secondary | ICD-10-CM

## 2016-11-25 NOTE — Progress Notes (Signed)
Sheri Watkins presents for NOB nurse interview visit. Pregnancy confirmation done at [facility].  G3 .  P1 . Pregnancy education material explained and given. No cats in the home. NOB labs ordered. TSH/HbgA1c due to Increased BMI. Of note pt is Vegan, gave advise on wise food choices for additional protein. HIV lab was explained optional and she did not decline. Drug screen declined. PNV encouraged. Genetic screening options discussed. Genetic testing: Ordered. Plans MaternIT at next visit. Pt may discuss with provider. Pt. To follow up with provider in 2 weeks for NOB physical as scheduled.  All questions answered.

## 2016-11-25 NOTE — Patient Instructions (Signed)
First Trimester of Pregnancy The first trimester of pregnancy is from week 1 until the end of week 13 (months 1 through 3). During this time, your baby will begin to develop inside you. At 6-8 weeks, the eyes and face are formed, and the heartbeat can be seen on ultrasound. At the end of 12 weeks, all the baby's organs are formed. Prenatal care is all the medical care you receive before the birth of your baby. Make sure you get good prenatal care and follow all of your doctor's instructions. Follow these instructions at home: Medicines  Take over-the-counter and prescription medicines only as told by your doctor. Some medicines are safe and some medicines are not safe during pregnancy.  Take a prenatal vitamin that contains at least 600 micrograms (mcg) of folic acid.  If you have trouble pooping (constipation), take medicine that will make your stool soft (stool softener) if your doctor approves. Eating and drinking  Eat regular, healthy meals.  Your doctor will tell you the amount of weight gain that is right for you.  Avoid raw meat and uncooked cheese.  If you feel sick to your stomach (nauseous) or throw up (vomit): ? Eat 4 or 5 small meals a day instead of 3 large meals. ? Try eating a few soda crackers. ? Drink liquids between meals instead of during meals.  To prevent constipation: ? Eat foods that are high in fiber, like fresh fruits and vegetables, whole grains, and beans. ? Drink enough fluids to keep your pee (urine) clear or pale yellow. Activity  Exercise only as told by your doctor. Stop exercising if you have cramps or pain in your lower belly (abdomen) or low back.  Do not exercise if it is too hot, too humid, or if you are in a place of great height (high altitude).  Try to avoid standing for long periods of time. Move your legs often if you must stand in one place for a long time.  Avoid heavy lifting.  Wear low-heeled shoes. Sit and stand up straight.  You  can have sex unless your doctor tells you not to. Relieving pain and discomfort  Wear a good support bra if your breasts are sore.  Take warm water baths (sitz baths) to soothe pain or discomfort caused by hemorrhoids. Use hemorrhoid cream if your doctor says it is okay.  Rest with your legs raised if you have leg cramps or low back pain.  If you have puffy, bulging veins (varicose veins) in your legs: ? Wear support hose or compression stockings as told by your doctor. ? Raise (elevate) your feet for 15 minutes, 3-4 times a day. ? Limit salt in your food. Prenatal care  Schedule your prenatal visits by the twelfth week of pregnancy.  Write down your questions. Take them to your prenatal visits.  Keep all your prenatal visits as told by your doctor. This is important. Safety  Wear your seat belt at all times when driving.  Make a list of emergency phone numbers. The list should include numbers for family, friends, the hospital, and police and fire departments. General instructions  Ask your doctor for a referral to a local prenatal class. Begin classes no later than at the start of month 6 of your pregnancy.  Ask for help if you need counseling or if you need help with nutrition. Your doctor can give you advice or tell you where to go for help.  Do not use hot tubs, steam rooms, or   saunas.  Do not douche or use tampons or scented sanitary pads.  Do not cross your legs for long periods of time.  Avoid all herbs and alcohol. Avoid drugs that are not approved by your doctor.  Do not use any tobacco products, including cigarettes, chewing tobacco, and electronic cigarettes. If you need help quitting, ask your doctor. You may get counseling or other support to help you quit.  Avoid cat litter boxes and soil used by cats. These carry germs that can cause birth defects in the baby and can cause a loss of your baby (miscarriage) or stillbirth.  Visit your dentist. At home, brush  your teeth with a soft toothbrush. Be gentle when you floss. Contact a doctor if:  You are dizzy.  You have mild cramps or pressure in your lower belly.  You have a nagging pain in your belly area.  You continue to feel sick to your stomach, you throw up, or you have watery poop (diarrhea).  You have a bad smelling fluid coming from your vagina.  You have pain when you pee (urinate).  You have increased puffiness (swelling) in your face, hands, legs, or ankles. Get help right away if:  You have a fever.  You are leaking fluid from your vagina.  You have spotting or bleeding from your vagina.  You have very bad belly cramping or pain.  You gain or lose weight rapidly.  You throw up blood. It may look like coffee grounds.  You are around people who have German measles, fifth disease, or chickenpox.  You have a very bad headache.  You have shortness of breath.  You have any kind of trauma, such as from a fall or a car accident. Summary  The first trimester of pregnancy is from week 1 until the end of week 13 (months 1 through 3).  To take care of yourself and your unborn baby, you will need to eat healthy meals, take medicines only if your doctor tells you to do so, and do activities that are safe for you and your baby.  Keep all follow-up visits as told by your doctor. This is important as your doctor will have to ensure that your baby is healthy and growing well. This information is not intended to replace advice given to you by your health care provider. Make sure you discuss any questions you have with your health care provider. Document Released: 08/14/2007 Document Revised: 03/05/2016 Document Reviewed: 03/05/2016 Elsevier Interactive Patient Education  2017 [employer]  

## 2016-11-26 LAB — HEMOGLOBIN A1C
Est. average glucose Bld gHb Est-mCnc: 114 mg/dL
Hgb A1c MFr Bld: 5.6 % (ref 4.8–5.6)

## 2016-11-26 LAB — URINALYSIS, ROUTINE W REFLEX MICROSCOPIC
BILIRUBIN UA: NEGATIVE
Glucose, UA: NEGATIVE
KETONES UA: NEGATIVE
Nitrite, UA: NEGATIVE
PROTEIN UA: NEGATIVE
RBC, UA: NEGATIVE
SPEC GRAV UA: 1.02 (ref 1.005–1.030)
Urobilinogen, Ur: 0.2 mg/dL (ref 0.2–1.0)
pH, UA: 6.5 (ref 5.0–7.5)

## 2016-11-26 LAB — CBC WITH DIFFERENTIAL/PLATELET
BASOS ABS: 0 10*3/uL (ref 0.0–0.2)
BASOS: 0 %
EOS (ABSOLUTE): 0 10*3/uL (ref 0.0–0.4)
EOS: 0 %
HEMATOCRIT: 35.4 % (ref 34.0–46.6)
HEMOGLOBIN: 11.7 g/dL (ref 11.1–15.9)
IMMATURE GRANS (ABS): 0 10*3/uL (ref 0.0–0.1)
Immature Granulocytes: 0 %
LYMPHS ABS: 2.4 10*3/uL (ref 0.7–3.1)
LYMPHS: 25 %
MCH: 28.7 pg (ref 26.6–33.0)
MCHC: 33.1 g/dL (ref 31.5–35.7)
MCV: 87 fL (ref 79–97)
MONOCYTES: 4 %
Monocytes Absolute: 0.4 10*3/uL (ref 0.1–0.9)
NEUTROS ABS: 6.8 10*3/uL (ref 1.4–7.0)
Neutrophils: 71 %
Platelets: 325 10*3/uL (ref 150–379)
RBC: 4.07 x10E6/uL (ref 3.77–5.28)
RDW: 13.2 % (ref 12.3–15.4)
WBC: 9.6 10*3/uL (ref 3.4–10.8)

## 2016-11-26 LAB — HEPATITIS B SURFACE ANTIGEN: HEP B S AG: NEGATIVE

## 2016-11-26 LAB — ANTIBODY SCREEN: Antibody Screen: NEGATIVE

## 2016-11-26 LAB — MICROSCOPIC EXAMINATION
CASTS: NONE SEEN /LPF
Epithelial Cells (non renal): >10 /hpf — AB (ref 0–10)
RBC MICROSCOPIC, UA: NONE SEEN /HPF (ref 0–?)

## 2016-11-26 LAB — URINE CULTURE

## 2016-11-26 LAB — HIV ANTIBODY (ROUTINE TESTING W REFLEX): HIV Screen 4th Generation wRfx: NONREACTIVE

## 2016-11-26 LAB — THYROID PANEL WITH TSH
FREE THYROXINE INDEX: 2.2 (ref 1.2–4.9)
T3 Uptake Ratio: 20 % — ABNORMAL LOW (ref 24–39)
T4 TOTAL: 10.9 ug/dL (ref 4.5–12.0)
TSH: 2.55 u[IU]/mL (ref 0.450–4.500)

## 2016-11-26 LAB — ABO AND RH: Rh Factor: POSITIVE

## 2016-11-26 LAB — RUBELLA SCREEN: Rubella Antibodies, IGG: 1.03 index (ref >0.99)

## 2016-11-26 LAB — HEPATITIS C ANTIBODY

## 2016-11-26 LAB — VARICELLA ZOSTER ANTIBODY, IGG: VARICELLA: 217 {index} (ref >165)

## 2016-12-02 ENCOUNTER — Encounter: Payer: Medicaid Other | Admitting: Obstetrics and Gynecology

## 2016-12-04 ENCOUNTER — Encounter: Payer: Self-pay | Admitting: Obstetrics and Gynecology

## 2016-12-04 ENCOUNTER — Ambulatory Visit (INDEPENDENT_AMBULATORY_CARE_PROVIDER_SITE_OTHER): Payer: Medicaid Other | Admitting: Obstetrics and Gynecology

## 2016-12-04 VITALS — BP 112/72 | HR 111 | Wt 163.6 lb

## 2016-12-04 DIAGNOSIS — Z3481 Encounter for supervision of other normal pregnancy, first trimester: Secondary | ICD-10-CM

## 2016-12-04 LAB — POCT URINALYSIS DIPSTICK
BILIRUBIN UA: NEGATIVE
Blood, UA: NEGATIVE
Glucose, UA: NEGATIVE
KETONES UA: NEGATIVE
LEUKOCYTES UA: NEGATIVE
NITRITE UA: NEGATIVE
Protein, UA: NEGATIVE
Spec Grav, UA: 1.01 (ref 1.010–1.025)
Urobilinogen, UA: 0.2 E.U./dL
pH, UA: 5 (ref 5.0–8.0)

## 2016-12-04 NOTE — Addendum Note (Signed)
Addended by: Brooke Dare on: 12/04/2016 04:07 PM   Modules accepted: Orders

## 2016-12-04 NOTE — Progress Notes (Signed)
NOB: Patient describes occasional nausea but is keeping some food and some liquids down. She is using Unisom successfully.  Physical examination General NAD, Conversant  HEENT Atraumatic; Op clear with mmm.  Normo-cephalic. Pupils reactive. Anicteric sclerae  Thyroid/Neck Smooth without nodularity or enlargement. Normal ROM.  Neck Supple.  Skin No rashes, lesions or ulceration. Normal palpated skin turgor. No nodularity.  Breasts: No masses or discharge.  Symmetric.  No axillary adenopathy.  Lungs: Clear to auscultation.No rales or wheezes. Normal Respiratory effort, no retractions.  Heart: NSR.  No murmurs or rubs appreciated. No periferal edema  Abdomen: Soft.  Non-tender.  No masses.  No HSM. No hernia  Extremities: Moves all appropriately.  Normal ROM for age. No lymphadenopathy.  Neuro: Oriented to PPT.  Normal mood. Normal affect.     Pelvic:   Vulva: Normal appearance.  No lesions.  Vagina: No lesions or abnormalities noted.  Support: Normal pelvic support.  Urethra No masses tenderness or scarring.  Meatus Normal size without lesions or prolapse.  Cervix: Normal appearance.  No lesions.  Anus: Normal exam.  No lesions.  Perineum: Normal exam.  No lesions.        Bimanual   Adnexae: No masses.  Non-tender to palpation.  Uterus: Enlarged. 11wks  Non-tender.  Mobile.  AV.  Adnexae: No masses.  Non-tender to palpation.  Cul-de-sac: Negative for abnormality.  Adnexae: No masses.  Non-tender to palpation.         Pelvimetry   Diagonal: Reached.  Spines: Average.  Sacrum: Concave.  Pubic Arch: Normal.   Old records reviewed reveal mild preeclampsia at term with small amount of proteinuria and elevated blood pressures.  Recommend ASA at 13 weeks.

## 2016-12-06 LAB — PAP IG, CT-NG, RFX HPV ASCU
Chlamydia, Nuc. Acid Amp: NEGATIVE
GONOCOCCUS BY NUCLEIC ACID AMP: NEGATIVE
PAP Smear Comment: 0

## 2016-12-09 ENCOUNTER — Telehealth: Payer: Self-pay

## 2016-12-09 NOTE — Telephone Encounter (Signed)
Pt informed of negative results per provider. 

## 2016-12-09 NOTE — Telephone Encounter (Signed)
Unable to contact patient at both numbers listed.

## 2016-12-09 NOTE — Telephone Encounter (Signed)
-----   Message from Linzie Collin, MD sent at 12/06/2016  7:23 PM EDT ----- Negative Pap and Negative GC/CT

## 2016-12-09 NOTE — Telephone Encounter (Signed)
Pt's husband returned call. Please advise. Thanks TNP

## 2016-12-31 ENCOUNTER — Encounter: Payer: Medicaid Other | Admitting: Obstetrics and Gynecology

## 2017-01-01 ENCOUNTER — Other Ambulatory Visit: Payer: Self-pay | Admitting: Obstetrics and Gynecology

## 2017-01-01 DIAGNOSIS — N926 Irregular menstruation, unspecified: Secondary | ICD-10-CM

## 2017-01-03 ENCOUNTER — Other Ambulatory Visit: Payer: Self-pay | Admitting: Obstetrics and Gynecology

## 2017-01-03 ENCOUNTER — Ambulatory Visit (INDEPENDENT_AMBULATORY_CARE_PROVIDER_SITE_OTHER): Payer: Medicaid Other | Admitting: Obstetrics and Gynecology

## 2017-01-03 ENCOUNTER — Ambulatory Visit (INDEPENDENT_AMBULATORY_CARE_PROVIDER_SITE_OTHER): Payer: Medicaid Other

## 2017-01-03 VITALS — BP 93/61 | HR 80 | Wt 163.1 lb

## 2017-01-03 DIAGNOSIS — N926 Irregular menstruation, unspecified: Secondary | ICD-10-CM

## 2017-01-03 DIAGNOSIS — Z3482 Encounter for supervision of other normal pregnancy, second trimester: Secondary | ICD-10-CM

## 2017-01-03 LAB — POCT URINALYSIS DIPSTICK
BILIRUBIN UA: NEGATIVE
Blood, UA: NEGATIVE
GLUCOSE UA: NEGATIVE
Ketones, UA: NEGATIVE
LEUKOCYTES UA: NEGATIVE
NITRITE UA: NEGATIVE
PH UA: 5 (ref 5.0–8.0)
Protein, UA: NEGATIVE
Spec Grav, UA: 1.02 (ref 1.010–1.025)
Urobilinogen, UA: 0.2 E.U./dL

## 2017-01-03 NOTE — Addendum Note (Signed)
Addended by: Brooke DareSICK, Adetokunbo Mccadden L on: 01/03/2017 11:27 AM   Modules accepted: Orders

## 2017-01-03 NOTE — Addendum Note (Signed)
Addended by: Brooke DareSICK, Kaevion Sinclair L on: 01/03/2017 11:15 AM   Modules accepted: Orders

## 2017-01-03 NOTE — Progress Notes (Signed)
ROB: No complaints.  Patient to start ASA 162mg  daily.  Ultrasound today.  Nausea controlled with Unisom.  MaterniT 21 today.

## 2017-01-06 ENCOUNTER — Other Ambulatory Visit: Payer: Medicaid Other

## 2017-01-08 LAB — MATERNIT 21 PLUS CORE, BLOOD
CHROMOSOME 13: NEGATIVE
Chromosome 18: NEGATIVE
Chromosome 21: NEGATIVE
Y Chromosome: NOT DETECTED

## 2017-01-09 ENCOUNTER — Telehealth: Payer: Self-pay

## 2017-01-09 NOTE — Telephone Encounter (Signed)
Husband informed. Expressed understanding of negative/normal genetic testing results.

## 2017-01-09 NOTE — Telephone Encounter (Signed)
-----   Message from Linzie Collinavid James Evans, MD sent at 01/09/2017 11:35 AM EDT ----- Fetal Screening test is negative for abnormalities.

## 2017-01-13 ENCOUNTER — Telehealth: Payer: Self-pay | Admitting: Obstetrics and Gynecology

## 2017-01-13 NOTE — Telephone Encounter (Signed)
Patient's husband called and stated that he would like to speak with a nurse in regards to his wife, She is vomitting and has not had a bowel movement in 3 days. The patient's husband stated that the patient is not feeling well, and she is taking medication as well.  No other information was disclosed. Please advise.

## 2017-01-13 NOTE — Telephone Encounter (Signed)
Pt c/o of constipation x 2 days. Is taking iron and pnv. Advised to add colace bid. Increase h20. Apple juice and prune juice. Fresh fruits and veggies. May add miralax daily. Pt to f/u at nv.
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
## 2017-01-20 ENCOUNTER — Telehealth: Payer: Self-pay | Admitting: Obstetrics and Gynecology

## 2017-01-20 NOTE — Telephone Encounter (Signed)
Patients husband came into the office and stated that he needs a letter to verify that the patient is not able to travel out of the country, because the airline is not willing to refund the patients ticket and they are also being charged a penalty of an additional $300.00. The patients husband also stated that the patient would be back when she is 27 weeks. The information was difficult to clarify, further investigation is need to clarify the issue. No other information was disclosed other than wanting to speak with a nurse as soon as possible to get this problem taken care of as soon as possible. Please advise.

## 2017-01-21 ENCOUNTER — Telehealth: Payer: Self-pay | Admitting: Obstetrics and Gynecology

## 2017-01-21 NOTE — Telephone Encounter (Signed)
The patients husband called and stated that he has not heard anything back in regards to his letter he needs, The patients husband stated that he received a call yesterday but there no documentation of that. Please advise.

## 2017-01-22 NOTE — Telephone Encounter (Signed)
This Clinical research associatewriter attempted to speak with the pt but pts husband kept speaking over her and as I was explaining to the pt that she could travel- per provider - husband could be heard and the phone then disconnected. Pt may travel - she will need to have some prenatal care- pt states she has a Dr at home- she was encouraged to get up and walk around during flight and to get support stockings to wear while flying.

## 2017-01-22 NOTE — Telephone Encounter (Signed)
Spoke with pts husband- pregnancy letter generated,printed and is at front desk ready for pickup.

## 2017-01-22 NOTE — Telephone Encounter (Signed)
Please call patient's husband again

## 2017-01-27 ENCOUNTER — Ambulatory Visit (INDEPENDENT_AMBULATORY_CARE_PROVIDER_SITE_OTHER): Payer: Medicaid Other | Admitting: Obstetrics and Gynecology

## 2017-01-27 ENCOUNTER — Other Ambulatory Visit: Payer: Self-pay | Admitting: Obstetrics and Gynecology

## 2017-01-27 ENCOUNTER — Encounter: Payer: Self-pay | Admitting: Obstetrics and Gynecology

## 2017-01-27 VITALS — BP 107/73 | HR 97 | Wt 165.2 lb

## 2017-01-27 DIAGNOSIS — Z3482 Encounter for supervision of other normal pregnancy, second trimester: Secondary | ICD-10-CM

## 2017-01-27 NOTE — Progress Notes (Signed)
ROB: Occasional heartburn but otherwise well. AFP today.  FAS U/S  Next visit.

## 2017-01-29 ENCOUNTER — Encounter: Payer: Medicaid Other | Admitting: Obstetrics and Gynecology

## 2017-01-30 LAB — AFP, SERUM, OPEN SPINA BIFIDA
AFP MoM: 2.35
AFP Value: 85.8 ng/mL
GEST. AGE ON COLLECTION DATE: 17.3 wk
Maternal Age At EDD: 30.2 yr
OSBR RISK 1 IN: 385
TEST RESULTS AFP: NEGATIVE
Weight: 165 [lb_av]

## 2017-02-05 NOTE — Telephone Encounter (Signed)
Attempted to contact pt- unable to leave a message due to mailbox not being setup.

## 2017-02-05 NOTE — Telephone Encounter (Signed)
-----   Message from Linzie Collinavid James Evans, MD sent at 02/04/2017 10:25 AM EST ----- Fetal Screening test is negative for abnormalities.

## 2017-02-06 ENCOUNTER — Telehealth: Payer: Self-pay

## 2017-02-06 NOTE — Telephone Encounter (Signed)
Negative/normal results given to husband.

## 2017-02-06 NOTE — Telephone Encounter (Signed)
-----   Message from David James Evans, MD sent at 02/04/2017 10:25 AM EST ----- Fetal Screening test is negative for abnormalities. 

## 2017-02-07 ENCOUNTER — Telehealth: Payer: Self-pay | Admitting: Obstetrics and Gynecology

## 2017-02-07 NOTE — Telephone Encounter (Signed)
The patients husband called and stated that the patient is sick and her face hurts. The patient would like to speak with a nurse only to discuss what his wife needs to take. I tried to go over our approved safe medications on the phone with him, but he did not want to take any suggestions from me. Please advise.

## 2017-02-07 NOTE — Telephone Encounter (Signed)
Spoke with pts husband- he reports pt has runny nose and sore throat. Advised cold and sore throat protocol. Husband also states pt has a nonpainful "peanut" inside her cheek. Advised to call the office on Monday to let me know how she is feeling. Husband is in agreement.

## 2017-02-10 ENCOUNTER — Telehealth: Payer: Self-pay

## 2017-02-10 NOTE — Telephone Encounter (Signed)
Pts husband came to the office requesting a call back. This Clinical research associatewriter contacted husband- he reported the pt "could not breathe" on Sat night but she went outside for fresh air and that helped. States he had it too hot inside the house. Pt is better now per husband. Also advised he call his insurance company for a list of PCPs and to pick one for assessment of the "peanut" in pts cheek.  Pts husband expressed understanding.

## 2017-02-18 ENCOUNTER — Other Ambulatory Visit: Payer: Self-pay | Admitting: Obstetrics and Gynecology

## 2017-02-18 DIAGNOSIS — Z369 Encounter for antenatal screening, unspecified: Secondary | ICD-10-CM

## 2017-02-19 ENCOUNTER — Telehealth: Payer: Self-pay | Admitting: Obstetrics and Gynecology

## 2017-02-19 NOTE — Telephone Encounter (Signed)
The patients husband called and stated that the patient is having back, groin, walking pain. The patients husband would like a call back. No other information was disclosed. Please advise.

## 2017-02-20 ENCOUNTER — Telehealth: Payer: Self-pay | Admitting: *Deleted

## 2017-02-20 NOTE — Telephone Encounter (Signed)
Patient husband called and states he had a missed call. Please see note from 02/19/17. Patient husband is requesting a call back. Call back number is  [phone]. Please advise. Thank you

## 2017-02-20 NOTE — Telephone Encounter (Signed)
Attempted to contact pt- no answer and unable to leave message due to mailbox not being setup.

## 2017-02-21 NOTE — Telephone Encounter (Signed)
Spoke with pt- she was encouraged to get a belly band to help support her pregnancy. Husband was in agreement.

## 2017-02-25 ENCOUNTER — Observation Stay
Admission: EM | Admit: 2017-02-25 | Discharge: 2017-02-25 | Disposition: A | Discharge status: Home / Self Care | Payer: Medicaid Other | Attending: Obstetrics and Gynecology | Admitting: Obstetrics and Gynecology

## 2017-02-25 DIAGNOSIS — Z3A21 21 weeks gestation of pregnancy: Secondary | ICD-10-CM | POA: Insufficient documentation

## 2017-02-25 DIAGNOSIS — O4702 False labor before 37 completed weeks of gestation, second trimester: Principal | ICD-10-CM | POA: Insufficient documentation

## 2017-02-25 DIAGNOSIS — Z3490 Encounter for supervision of normal pregnancy, unspecified, unspecified trimester: Secondary | ICD-10-CM

## 2017-02-25 NOTE — OB Triage Note (Signed)
Patient given discharge instructions and verbalized understanding.

## 2017-02-25 NOTE — OB Triage Note (Signed)
Patient arrived in triage with c/o pain for the last two weeks.  Describes lower pelvic and groin pain with walking. Rates pain "2/10". Patient and husband explained that there was a misunderstanding with the pain rating.  Patient mistakenly rated pain "9/10" thinking that the higher numbers were less severe pain and the doctor sent them to triage right away.  Patient also reports constipation x 2-3 days. Patient has not started to feel the baby move yet this pregnancy. Denies leaking of fluid and vaginal bleeding. FHT obtained as documented. Abdomen palpates soft. External toco applied. Discussed plan of care with patient and husband who verbalized understanding.

## 2017-02-26 NOTE — Discharge Summary (Signed)
L&D OB Triage Note  SUBJECTIVE Sheri Watkins is a 31 y.o. G3P1011 female at 1056w4d, EDD Estimated Date of Delivery: 07/05/17 who presented to triage with complaints of lower abdominal pain, worse with movement.  No vaginal bleeding or discharge.  No cyclic pain.  Patient was confused by information gleaned from triage nurse.  She misinterpreted the pain scale and quoted a pain 8 out of 10 (she thought the higher the number of the lower of the pain)   OBJECTIVE Nursing Evaluation: BP 124/81 (BP Location: Left Arm)   Pulse 85   Temp 98.2 F (36.8 C) (Oral)   Resp 18   Ht 5\' 6"  (1.676 m)   Wt 169 lb (76.7 kg)   LMP 09/06/2016 (Exact Date)   BMI 27.28 kg/m  no significant findings for preterm labor  NST was performed and has been reviewed by me.  NST INTERPRETATION: Indications: rule out uterine contractions  Mode: Doppler Baseline Rate (A): 160 bpm(-165)           Contraction Frequency (min): none noted(maternal movement noted)  ASSESSMENT Impression:  1. Pregnancy:  G3P1011 at 6156w4d , EDD Estimated Date of Delivery: 07/05/17 2.  Normal fetal heart rate; no contractions 3.  Suspect round ligament pain  PLAN 1. Reassurance given 2. Discharge home with bleeding/labor precautions.  3. Continue routine prenatal care.   Herold HarmsMartin A Kristyl Athens, MD

## 2017-03-07 ENCOUNTER — Ambulatory Visit (INDEPENDENT_AMBULATORY_CARE_PROVIDER_SITE_OTHER): Payer: Medicaid Other

## 2017-03-07 ENCOUNTER — Encounter: Payer: Self-pay | Admitting: Obstetrics and Gynecology

## 2017-03-07 ENCOUNTER — Ambulatory Visit (INDEPENDENT_AMBULATORY_CARE_PROVIDER_SITE_OTHER): Payer: Medicaid Other | Admitting: Obstetrics and Gynecology

## 2017-03-07 VITALS — BP 114/78 | HR 111 | Wt 175.5 lb

## 2017-03-07 DIAGNOSIS — R1110 Vomiting, unspecified: Secondary | ICD-10-CM

## 2017-03-07 DIAGNOSIS — Z23 Encounter for immunization: Secondary | ICD-10-CM

## 2017-03-07 DIAGNOSIS — O26892 Other specified pregnancy related conditions, second trimester: Secondary | ICD-10-CM

## 2017-03-07 DIAGNOSIS — Z3482 Encounter for supervision of other normal pregnancy, second trimester: Secondary | ICD-10-CM

## 2017-03-07 DIAGNOSIS — Z369 Encounter for antenatal screening, unspecified: Secondary | ICD-10-CM | POA: Diagnosis not present

## 2017-03-07 DIAGNOSIS — Z130 Encounter for screening for diseases of the blood and blood-forming organs and certain disorders involving the immune mechanism: Secondary | ICD-10-CM

## 2017-03-07 DIAGNOSIS — Z131 Encounter for screening for diabetes mellitus: Secondary | ICD-10-CM

## 2017-03-07 DIAGNOSIS — O26899 Other specified pregnancy related conditions, unspecified trimester: Secondary | ICD-10-CM

## 2017-03-07 DIAGNOSIS — Z113 Encounter for screening for infections with a predominantly sexual mode of transmission: Secondary | ICD-10-CM

## 2017-03-07 DIAGNOSIS — K5900 Constipation, unspecified: Secondary | ICD-10-CM

## 2017-03-07 DIAGNOSIS — R102 Pelvic and perineal pain: Secondary | ICD-10-CM

## 2017-03-07 LAB — POCT URINALYSIS DIPSTICK
Bilirubin, UA: NEGATIVE
GLUCOSE UA: NEGATIVE
Ketones, UA: NEGATIVE
Nitrite, UA: NEGATIVE
Protein, UA: NEGATIVE
RBC UA: NEGATIVE
Spec Grav, UA: >=1.03 — AB (ref 1.010–1.025)
Urobilinogen, UA: 0.2 E.U./dL
pH, UA: 6.5 (ref 5.0–8.0)

## 2017-03-07 MED ORDER — POLYETHYLENE GLYCOL 3350 17 G PO PACK: 17.0000 g | PACK | Freq: Every day | ORAL | 0 refills | Status: DC

## 2017-03-07 NOTE — Progress Notes (Signed)
ROB- Pt has been coughing, and pelvic pain and pressure

## 2017-03-07 NOTE — Progress Notes (Signed)
ROB: Patient c/o constipation. Was prescribed Colace last visit but notes it it s not helping. Advised on Miralax or milk of magnesium. Also continued to encourage increasing fiber intake and prune/apple juice. Also notes complaints of round ligament pain, discussed management measures.  Patient also reports post-tussive vomiting. Is taking Robitussin and cough lozenges but notes that coughing is usually due to phlegm and congestion when lying down at night. Advised on Sudafed, plain Mucinex.   Patient also for discussion of anatomy scan.  Pending final report, however generalized edema noted fetus of (likely hydrops).  Organs appeared grossly normal, but detailed evaluation was difficult and incomplete. Unable to visualize kidneys.  Reviewed briefly  possible causes with patient (including immune and non-immune causes).  Patient did have normal genetic screen with MaterniT21 and AFP. [facility] to determine potential cause and f/u evaluation and management. RTC in 4 weeks. For glucola at that time.    A total of 15 minutes were spent face-to-face with the patient during this encounter and over half of that time dealt with counseling and coordination of care.

## 2017-03-07 NOTE — Patient Instructions (Signed)

## 2017-03-10 ENCOUNTER — Telehealth: Payer: Self-pay | Admitting: Obstetrics and Gynecology

## 2017-03-10 NOTE — Telephone Encounter (Signed)
I called the patient and advised that Dr. Valentino Saxonherry needed her to schedule a 1 hr GT, And the patient's husband stated that the patient stomach/abdomial area is "really tight" and would like to speak with a nurse. Please advise.

## 2017-03-11 NOTE — L&D Delivery Note (Signed)
       Delivery Note   Sheri LawsBhumiben Eckmann is a 32 y.o. G3P1011 at 546w5d Estimated Date of Delivery: 07/05/17  PRE-OPERATIVE DIAGNOSIS:  1) 6646w5d pregnancy.  2) IUFD  POST-OPERATIVE DIAGNOSIS:  1) 1746w5d pregnancy s/p 2) Same   Delivery Type:  Vagina Breech  Delivery Anesthesia: Epidural  Labor Complications:     ESTIMATED BLOOD LOSS:  100ml    FINDINGS:   1) female infant, Apgar scores of 0  at 1 minute and 0  at 5 minutes .     SPECIMENS:   PLACENTA:   Appearance: Normal   Removal:  Spon    Disposition: To Pathology  DISPOSITION:  Infant to Pathology for Gross Examination  NARRATIVE SUMMARY: Labor course:  [name] is a G3P1011 at 6546w5d who presented for induction of labor for IUFD.  She progressed well in labor Vaginal Cytotec.  She received the epidural anesthesia and proceeded to complete dilation. She evidenced good maternal expulsive effort during the second stage. She went on to deliver a non-viable infant.  Infant was macerated The placenta delivered without problems and was noted to be complete. A perineal and vaginal examination was performed. Episiotomy/Lacerations: No tears  Elonda Huskyavid J. Evans, M.D. 03/27/2017 8:54 PM

## 2017-03-12 ENCOUNTER — Telehealth: Payer: Self-pay

## 2017-03-12 ENCOUNTER — Telehealth: Payer: Self-pay | Admitting: Obstetrics and Gynecology

## 2017-03-12 ENCOUNTER — Other Ambulatory Visit: Payer: Self-pay | Admitting: Obstetrics and Gynecology

## 2017-03-12 NOTE — Telephone Encounter (Signed)
The patients husband gave Sheri Watkins a call back and stated that the patient's stomach is really tight. He is making the patient cut back on salt and now she is drinking Orange juice. No other information was disclosed. Please advise.

## 2017-03-12 NOTE — Telephone Encounter (Signed)
Attempted to again return pts call- no answer or voicemail.

## 2017-03-13 DIAGNOSIS — R220 Localized swelling, mass and lump, head: Secondary | ICD-10-CM | POA: Diagnosis not present

## 2017-03-13 DIAGNOSIS — H6060 Unspecified chronic otitis externa, unspecified ear: Secondary | ICD-10-CM | POA: Diagnosis not present

## 2017-03-14 ENCOUNTER — Telehealth: Payer: Self-pay

## 2017-03-14 NOTE — Telephone Encounter (Signed)
Spoke with pts husband- states pt was upset for about 3 days because baby has extra fluid. [facility]. Husband states they will keep that appointment.

## 2017-03-18 ENCOUNTER — Encounter: Payer: Self-pay | Admitting: Obstetrics and Gynecology

## 2017-03-24 ENCOUNTER — Other Ambulatory Visit: Payer: Self-pay | Admitting: *Deleted

## 2017-03-24 DIAGNOSIS — O30009 Twin pregnancy, unspecified number of placenta and unspecified number of amniotic sacs, unspecified trimester: Secondary | ICD-10-CM

## 2017-03-26 ENCOUNTER — Telehealth: Payer: Self-pay

## 2017-03-26 ENCOUNTER — Telehealth: Payer: Self-pay | Admitting: *Deleted

## 2017-03-26 NOTE — Telephone Encounter (Signed)
Patient husband called and states that patient is having trouble urinating and also constipation. Patient states she has been constipated for the last 3 days and having trouble urinating this afternoon. Patient is requesting a call back. Her contact # [phone]. Please advise. Thank you

## 2017-03-26 NOTE — Telephone Encounter (Signed)
Spoke with pt- she states she is urinating now and she drank some plum juice to help with her constipation. Her husband stated he would call me Friday to let me know how pt is doing.

## 2017-03-27 ENCOUNTER — Ambulatory Visit
Admission: RE | Admit: 2017-03-27 | Discharge: 2017-03-27 | Disposition: A | Discharge status: Home / Self Care | Payer: Medicaid Other | Source: Ambulatory Visit | Attending: Obstetrics and Gynecology | Admitting: Obstetrics and Gynecology

## 2017-03-27 ENCOUNTER — Inpatient Hospital Stay: Payer: Medicaid Other | Admitting: Certified Registered Nurse Anesthetist

## 2017-03-27 ENCOUNTER — Ambulatory Visit: Payer: Medicaid Other

## 2017-03-27 ENCOUNTER — Other Ambulatory Visit: Payer: Self-pay

## 2017-03-27 ENCOUNTER — Other Ambulatory Visit: Payer: Self-pay | Admitting: Obstetrics and Gynecology

## 2017-03-27 ENCOUNTER — Inpatient Hospital Stay
Admission: RE | Admit: 2017-03-27 | Discharge: 2017-03-28 | DRG: 807 | Disposition: A | Discharge status: Home / Self Care | Payer: Medicaid Other | Source: Ambulatory Visit | Attending: Obstetrics and Gynecology | Admitting: Obstetrics and Gynecology

## 2017-03-27 ENCOUNTER — Inpatient Hospital Stay (HOSPITAL_BASED_OUTPATIENT_CLINIC_OR_DEPARTMENT_OTHER): Admit: 2017-03-27 | Discharge: 2017-03-27 | Disposition: A | Discharge status: Home / Self Care | Payer: Medicaid Other

## 2017-03-27 DIAGNOSIS — O321XX0 Maternal care for breech presentation, not applicable or unspecified: Secondary | ICD-10-CM | POA: Diagnosis present

## 2017-03-27 DIAGNOSIS — O364XX0 Maternal care for intrauterine death, not applicable or unspecified: Secondary | ICD-10-CM | POA: Diagnosis not present

## 2017-03-27 DIAGNOSIS — O364XX1 Maternal care for intrauterine death, fetus 1: Secondary | ICD-10-CM

## 2017-03-27 DIAGNOSIS — Z3A25 25 weeks gestation of pregnancy: Secondary | ICD-10-CM | POA: Diagnosis not present

## 2017-03-27 DIAGNOSIS — O30009 Twin pregnancy, unspecified number of placenta and unspecified number of amniotic sacs, unspecified trimester: Secondary | ICD-10-CM

## 2017-03-27 LAB — CBC
HCT: 32.9 % — ABNORMAL LOW (ref 35.0–47.0)
HEMOGLOBIN: 11.2 g/dL — AB (ref 12.0–16.0)
MCH: 29.4 pg (ref 26.0–34.0)
MCHC: 34.2 g/dL (ref 32.0–36.0)
MCV: 85.9 fL (ref 80.0–100.0)
Platelets: 231 10*3/uL (ref 150–440)
RBC: 3.83 MIL/uL (ref 3.80–5.20)
RDW: 12.8 % (ref 11.5–14.5)
WBC: 11.2 10*3/uL — ABNORMAL HIGH (ref 3.6–11.0)

## 2017-03-27 LAB — PROTIME-INR
INR: 0.98
PROTHROMBIN TIME: 12.9 s (ref 11.4–15.2)

## 2017-03-27 LAB — TYPE AND SCREEN
ABO/RH(D): A POS
ANTIBODY SCREEN: NEGATIVE

## 2017-03-27 MED ORDER — SODIUM CHLORIDE 0.9 % IV SOLN: 2.0000 g | Freq: Once | INTRAVENOUS | Status: DC

## 2017-03-27 MED ORDER — LIDOCAINE HCL (PF) 1 % IJ SOLN
INTRAMUSCULAR | Status: AC
  Filled 2017-03-27: qty 30

## 2017-03-27 MED ORDER — LACTATED RINGERS IV SOLN
INTRAVENOUS | Status: DC
  Administered 2017-03-27: 11:00:00 via INTRAVENOUS

## 2017-03-27 MED ORDER — CLINDAMYCIN PHOSPHATE 900 MG/50ML IV SOLN: 900.0000 mg | Freq: Once | INTRAVENOUS | Status: DC

## 2017-03-27 MED ORDER — EPHEDRINE 5 MG/ML INJ: 10.0000 mg | INTRAVENOUS | Status: DC | PRN

## 2017-03-27 MED ORDER — ACETAMINOPHEN 325 MG PO TABS: 650.0000 mg | ORAL_TABLET | ORAL | Status: DC | PRN

## 2017-03-27 MED ORDER — MISOPROSTOL 200 MCG PO TABS
ORAL_TABLET | ORAL | Status: AC
  Filled 2017-03-27: qty 4

## 2017-03-27 MED ORDER — LIDOCAINE HCL (PF) 1 % IJ SOLN: 30.0000 mL | INTRAMUSCULAR | Status: DC | PRN

## 2017-03-27 MED ORDER — OXYTOCIN 10 UNIT/ML IJ SOLN
INTRAMUSCULAR | Status: AC
  Filled 2017-03-27: qty 2

## 2017-03-27 MED ORDER — FENTANYL 2.5 MCG/ML W/ROPIVACAINE 0.15% IN NS 100 ML EPIDURAL (ARMC)
EPIDURAL | Status: AC
  Filled 2017-03-27: qty 100

## 2017-03-27 MED ORDER — FENTANYL 2.5 MCG/ML W/ROPIVACAINE 0.15% IN NS 100 ML EPIDURAL (ARMC)
EPIDURAL | Status: DC | PRN
  Administered 2017-03-27: 12 mL/h via EPIDURAL

## 2017-03-27 MED ORDER — OXYTOCIN 40 UNITS IN LACTATED RINGERS INFUSION - SIMPLE MED
2.5000 [IU]/h | INTRAVENOUS | Status: DC
  Filled 2017-03-27: qty 1000

## 2017-03-27 MED ORDER — LACTATED RINGERS IV SOLN: 500.0000 mL | Freq: Once | INTRAVENOUS | Status: DC

## 2017-03-27 MED ORDER — MISOPROSTOL 200 MCG PO TABS
400.0000 ug | ORAL_TABLET | ORAL | Status: DC
  Administered 2017-03-27: 400 ug via VAGINAL

## 2017-03-27 MED ORDER — BUPIVACAINE HCL (PF) 0.25 % IJ SOLN
INTRAMUSCULAR | Status: DC | PRN
  Administered 2017-03-27: 4 mL via EPIDURAL
  Administered 2017-03-27: 5 mL via EPIDURAL

## 2017-03-27 MED ORDER — DIPHENHYDRAMINE HCL 50 MG/ML IJ SOLN
12.5000 mg | INTRAMUSCULAR | Status: DC | PRN
  Administered 2017-03-27: 12.5 mg via INTRAVENOUS
  Filled 2017-03-27: qty 1

## 2017-03-27 MED ORDER — LIDOCAINE HCL (PF) 1 % IJ SOLN
INTRAMUSCULAR | Status: DC | PRN
  Administered 2017-03-27: 3 mL

## 2017-03-27 MED ORDER — PHENYLEPHRINE 40 MCG/ML (10ML) SYRINGE FOR IV PUSH (FOR BLOOD PRESSURE SUPPORT): 80.0000 ug | PREFILLED_SYRINGE | INTRAVENOUS | Status: DC | PRN

## 2017-03-27 MED ORDER — MISOPROSTOL 200 MCG PO TABS
ORAL_TABLET | ORAL | Status: AC
  Filled 2017-03-27: qty 2

## 2017-03-27 MED ORDER — LIDOCAINE-EPINEPHRINE (PF) 1.5 %-1:200000 IJ SOLN
INTRAMUSCULAR | Status: DC | PRN
  Administered 2017-03-27: 3 mL via PERINEURAL

## 2017-03-27 MED ORDER — FENTANYL 2.5 MCG/ML W/ROPIVACAINE 0.15% IN NS 100 ML EPIDURAL (ARMC)
12.0000 mL/h | EPIDURAL | Status: DC
  Administered 2017-03-27: 12 mL/h via EPIDURAL
  Filled 2017-03-27: qty 100

## 2017-03-27 MED ORDER — LACTATED RINGERS IV SOLN: 500.0000 mL | INTRAVENOUS | Status: DC | PRN

## 2017-03-27 MED ORDER — ONDANSETRON HCL 4 MG/2ML IJ SOLN: 4.0000 mg | Freq: Four times a day (QID) | INTRAMUSCULAR | Status: DC | PRN

## 2017-03-27 MED ORDER — MISOPROSTOL 200 MCG PO TABS
400.0000 ug | ORAL_TABLET | ORAL | Status: AC
  Administered 2017-03-27 (×2): 400 ug via VAGINAL
  Filled 2017-03-27 (×2): qty 2

## 2017-03-27 MED ORDER — BUTORPHANOL TARTRATE 1 MG/ML IJ SOLN: 1.0000 mg | INTRAMUSCULAR | Status: DC | PRN

## 2017-03-27 MED ORDER — SOD CITRATE-CITRIC ACID 500-334 MG/5ML PO SOLN: 30.0000 mL | ORAL | Status: DC | PRN

## 2017-03-27 MED ORDER — AMMONIA AROMATIC IN INHA
RESPIRATORY_TRACT | Status: AC
  Filled 2017-03-27: qty 10

## 2017-03-27 MED ORDER — OXYTOCIN BOLUS FROM INFUSION: 500.0000 mL | Freq: Once | INTRAVENOUS | Status: DC

## 2017-03-27 MED ORDER — LACTATED RINGERS IV SOLN: INTRAVENOUS | Status: DC

## 2017-03-27 NOTE — Anesthesia Preprocedure Evaluation (Signed)
Anesthesia Evaluation  Patient identified by MRN, date of birth, ID band Patient awake    Reviewed: Allergy & Precautions, H&P , NPO status , Patient's Chart, lab work & pertinent test results  Airway Mallampati: II   Neck ROM: full  Mouth opening: Limited Mouth Opening  Dental  (+) Teeth Intact   Pulmonary neg pulmonary ROS,    Pulmonary exam normal        Cardiovascular Exercise Tolerance: Good negative cardio ROS Normal cardiovascular exam     Neuro/Psych    GI/Hepatic negative GI ROS, Neg liver ROS,   Endo/Other  negative endocrine ROS  Renal/GU negative Renal ROS     Musculoskeletal   Abdominal   Peds  Hematology negative hematology ROS (+)   Anesthesia Other Findings   Reproductive/Obstetrics (+) Pregnancy                             Anesthesia Physical Anesthesia Plan  ASA: II  Anesthesia Plan: Epidural   Post-op Pain Management:    Induction:   PONV Risk Score and Plan:   Airway Management Planned:   Additional Equipment:   Intra-op Plan:   Post-operative Plan:   Informed Consent:   Dental Advisory Given  Plan Discussed with: Anesthesiologist  Anesthesia Plan Comments:         Anesthesia Quick Evaluation

## 2017-03-27 NOTE — H&P (Signed)
History and Physical   HPI  Sheri Watkins is a 32 y.o. G3P1011 at 4478w5d Estimated Date of Delivery: 07/05/17 who is being admitted for  induction of labor for IUFD 22-25 weeks   OB History  Obstetric History   G3   P1   T1   P0   A1   L1    SAB1   TAB0   Ectopic0   Multiple0   Live Births1     # Outcome Date GA Lbr Len/2nd Weight Sex Delivery Anes PTL Lv  3 Current           2 Term 11/27/14 1463w4d 03:04 / 04:23 7 lb 2.6 oz (3.249 kg) F Vag-Spont Local  LIV     Name: Blucher,GIRL Margrit     Apgar1:  8                Apgar5: 9  1 SAB               PROBLEM LIST  Pregnancy complications or risks: Patient Active Problem List   Diagnosis Date Noted  . Fetal demise, greater than 22 weeks, antepartum, fetus 1 03/27/2017  . Fetal demise before 22 weeks with retention of dead fetus   . Fetal hydrops   . Pregnancy 02/25/2017  . Overweight 03/14/2016  . Irregular menses 03/14/2016  . History of iron deficiency anemia 11/28/2014  . History of pre-eclampsia in prior pregnancy, currently pregnant 11/26/2014    Prenatal labs and studies: ABO, Rh: --/--/A POS (01/17 1048) Antibody: NEG (01/17 1048) Rubella: 1.03 (09/17 1535) RPR:    HBsAg: Negative (09/17 1535)  HIV: Non Reactive (09/17 1535)  GBS:    Past Medical History:  Diagnosis Date  . Acute blood loss anemia 11/28/2014  . Maternal iron deficiency anemia 11/28/2014  . Medical history non-contributory   . Postpartum care following vaginal delivery (9/18) 11/28/2014     Past Surgical History:  Procedure Laterality Date  . DILATION AND CURETTAGE OF UTERUS    . NO PAST SURGERIES       Medications    Current Discharge Medication List    CONTINUE these medications which have NOT CHANGED   Details  aspirin EC 81 MG tablet Take 81 mg daily by mouth.    docusate sodium (COLACE) 100 MG capsule Take 100 mg 2 (two) times daily by mouth.    Fe Bisgly-Succ-C-Thre-B12-FA (IRON-150 PO) Take by mouth.    Prenatal  Vit-Fe Fumarate-FA (MULTIVITAMIN-PRENATAL) 27-0.8 MG TABS tablet Take 1 tablet by mouth daily at 12 noon.    doxylamine, Sleep, (UNISOM) 25 MG tablet Take 25 mg by mouth at bedtime as needed.    polyethylene glycol (MIRALAX / GLYCOLAX) packet Take 17 g by mouth daily. Qty: 14 each, Refills: 0    pyridOXINE (VITAMIN B-6) 100 MG tablet Take 100 mg by mouth daily.         Allergies  Patient has no known allergies.  Review of Systems  Pertinent items noted in HPI and remainder of comprehensive ROS otherwise negative.  Physical Exam  BP 124/60   Pulse (!) 110   Temp 97.9 F (36.6 C) (Oral)   Resp 16   Ht 5\' 6"  (1.676 m)   Wt 175 lb (79.4 kg)   LMP 09/06/2016 (Exact Date)   SpO2 100%   BMI 28.25 kg/m   Lungs:  CTA B Cardio: RRR without M/R/G Abd: Soft, gravid, NT Presentation: breech by [facility] U/S EXT: No C/C/ 1+ Edema  DTRs: 2+ B CERVIX: Closed    See Prenatal records for more detailed PE.     FHR:  Absent  Toco: Uterine Contractions: None   Test Results  Results for orders placed or performed during the hospital encounter of 03/27/17 (from the past 24 hour(s))  Protime-INR     Status: None   Collection Time: 03/27/17 10:48 AM  Result Value Ref Range   Prothrombin Time 12.9 11.4 - 15.2 seconds   INR 0.98   Type and screen [facility]     Status: None   Collection Time: 03/27/17 10:48 AM  Result Value Ref Range   ABO/RH(D) A POS    Antibody Screen NEG    Sample Expiration      03/30/2017 Performed at [facility], [address] [place], [place] [address]   CBC     Status: Abnormal   Collection Time: 03/27/17 10:48 AM  Result Value Ref Range   WBC 11.2 (H) 3.6 - 11.0 K/uL   RBC 3.83 3.80 - 5.20 MIL/uL   Hemoglobin 11.2 (L) 12.0 - 16.0 g/dL   HCT 60.4 (L) 54.0 - 98.1 %   MCV 85.9 80.0 - 100.0 fL   MCH 29.4 26.0 - 34.0 pg   MCHC 34.2 32.0 - 36.0 g/dL   RDW 19.1 47.8 - 29.5 %   Platelets 231 150 - 440 K/uL     Assessment   G3P1011 at [redacted]w[redacted]d Estimated Date of Delivery: 07/05/17    Patient Active Problem List   Diagnosis Date Noted  . Fetal demise, greater than 22 weeks, antepartum, fetus 1 03/27/2017  . Fetal demise before 22 weeks with retention of dead fetus   . Fetal hydrops   . Pregnancy 02/25/2017  . Overweight 03/14/2016  . Irregular menses 03/14/2016  . History of iron deficiency anemia 11/28/2014  . History of pre-eclampsia in prior pregnancy, currently pregnant 11/26/2014    Plan  1. Admit to L&D :   Prenatal screening/diagnostic bloodwork per [facility]. 2. Discussed plan and current condition - questions answered. 3. Epidural if desired. 4. Admission labs  5. Antiemetic as needed 6.  Cytotec given ( ) vaginal  Elonda Husky, M.D. 03/27/2017 1:27 PM

## 2017-03-27 NOTE — Anesthesia Procedure Notes (Signed)
Epidural Patient location during procedure: OB Start time: 03/27/2017 12:59 PM End time: 03/27/2017 1:12 PM  Staffing Anesthesiologist: Piscitello, Cleda MccreedyJoseph K, MD Resident/CRNA: Ginger CarneMichelet, Baylon Santelli, CRNA Performed: resident/CRNA   Preanesthetic Checklist Completed: patient identified, site marked, surgical consent, pre-op evaluation, timeout performed, IV checked, risks and benefits discussed and monitors and equipment checked  Epidural Patient position: sitting Prep: Betadine Patient monitoring: heart rate, continuous pulse ox and blood pressure Approach: midline Location: L4-L5 Injection technique: LOR saline  Needle:  Needle type: Tuohy  Needle gauge: 17 G Needle length: 9 cm and 9 Needle insertion depth: 5.5 cm Catheter type: closed end flexible Catheter size: 19 Gauge Catheter at skin depth: 11 cm Test dose: negative and 1.5% lidocaine with Epi 1:200 K  Assessment Sensory level: T10 Events: blood not aspirated, injection not painful, no injection resistance, negative IV test and no paresthesia  Additional Notes Pt. Evaluated and documentation done after procedure finished. Patient identified. Risks/Benefits/Options discussed with patient including but not limited to bleeding, infection, nerve damage, paralysis, failed block, incomplete pain control, headache, blood pressure changes, nausea, vomiting, reactions to medication both or allergic, itching and postpartum back pain. Confirmed with bedside nurse the patient's most recent platelet count. Confirmed with patient that they are not currently taking any anticoagulation, have any bleeding history or any family history of bleeding disorders. Patient expressed understanding and wished to proceed. All questions were answered. Sterile technique was used throughout the entire procedure. Please see nursing notes for vital signs. Test dose was given through epidural catheter and negative prior to continuing to dose epidural or start  infusion. Warning signs of high block given to the patient including shortness of breath, tingling/numbness in hands, complete motor block, or any concerning symptoms with instructions to call for help. Patient was given instructions on fall risk and not to get out of bed. All questions and concerns addressed with instructions to call with any issues or inadequate analgesia.   Patient tolerated the insertion well without immediate complications.Reason for block:procedure for pain

## 2017-03-27 NOTE — Progress Notes (Signed)
Pt seen by me, agree with assessment in [facility]'s note.

## 2017-03-27 NOTE — Progress Notes (Signed)
  Referring physician:  [facility] Length of Consultation:  25 minutes  [name] and her husband were seen for genetic counseling following [facility] on 03/27/2017. The ultrasound revealed a fetal demise at [redacted] weeks gestation.  See that ultrasound report for details.  Evaluation of the fetal anatomy was limited.    We obtained a detailed family history and pregnancy history.  [name] reported one maternal aunt who had three early miscarriages but went on to have 4 healthy children.  The cause for her losses is not known, but no other family members are said to have had multiple losses, so no additional testing would be suggested at this time.  In addition, the patient's father is reported to have bilateral postaxial polydactyly of the feet. He is otherwise healthy and no other family members are known to have a similar finding.  We reviewed that polydactyly is not an uncommon trait and can occur as an isolated finding or as part of numerous genetic syndromes.  In some families, it occurs as a dominant genetic trait.  Because he has no other health or developmental concerns, we would not offer additional testing for this condition.  The remainder of the family history is unremarkable for birth defects, developmental delays, known genetic conditions, or infant death.  The couple is non-consanguinous and of [place] ancestry.  Also of note, [name]'s MCV is 87, which makes is highly unlikely that she is a carrier for alpha thalassemia.  The ultrasound performed at [facility] on 03/07/2017 references possible edema.  Today's ultrasound showed a fetal demise, which likely occurred 2-3 weeks ago.  There is noted skin edema, ascites, pericardial and pleural edema, consistent with fetal hydrops.  Hydrops can happen for many reasons including a chromosome condition, structural heart defect, genetic syndrome, infection and many other causes.  Prior testing in  this pregnancy included MaterniT21 and msAFP, which were both normal.  [name] left our clinic to go to Labor and Delivery.  Dr. Dolphus JennySmall included a list of recommendations for testing upon delivery.  In addition to maternal studies she suggested, I completed a [facility] requisition for chromosomal microarray testing with the request to hold DNA for possible additional testing.  If all tests are normal, then we can consider genetic testing for a non-immune hydrops genetic panel at [facility].  This would include genes known to be associated with fetal hydrops.  A fetal autopsy was also recommended, which may help provide information about possible structural anomalies that could contribute.  We will follow up with that patient by phone once the results of the chromosomal microarray become available and are available to scheduled additional genetic counseling as needed.  We may be reached at [phone].  Sheri Andersoneborah F. Oisin Yoakum, MS, CGC

## 2017-03-28 LAB — PARVOVIRUS B19 ANTIBODY, IGG AND IGM
PAROVIRUS B19 IGM ABS: 0.1 {index} (ref 0.0–0.8)
Parovirus B19 IgG Abs: 0.1 index (ref 0.0–0.8)

## 2017-03-28 LAB — RPR: RPR: NONREACTIVE

## 2017-03-28 LAB — TORCH-IGM(TOXO/ RUB/ CMV/ HSV) W TITER
CMV IgM: <30 AU/mL (ref 0.0–29.9)
Rubella IgM: <20 AU/mL (ref 0.0–19.9)
Toxoplasma Antibody- IgM: <3 AU/mL (ref 0.0–7.9)

## 2017-03-28 LAB — INFECT DISEASE AB IGM REFLEX 1

## 2017-03-28 MED ORDER — SIMETHICONE 80 MG PO CHEW: 80.0000 mg | CHEWABLE_TABLET | ORAL | Status: DC | PRN

## 2017-03-28 MED ORDER — DOCUSATE SODIUM 100 MG PO CAPS: 100.0000 mg | ORAL_CAPSULE | Freq: Two times a day (BID) | ORAL | Status: DC

## 2017-03-28 MED ORDER — OXYCODONE-ACETAMINOPHEN 5-325 MG PO TABS: 1.0000 | ORAL_TABLET | ORAL | Status: DC | PRN

## 2017-03-28 MED ORDER — DIPHENHYDRAMINE HCL 25 MG PO CAPS: 25.0000 mg | ORAL_CAPSULE | Freq: Four times a day (QID) | ORAL | Status: DC | PRN

## 2017-03-28 MED ORDER — TETANUS-DIPHTH-ACELL PERTUSSIS 5-2.5-18.5 LF-MCG/0.5 IM SUSP
0.5000 mL | Freq: Once | INTRAMUSCULAR | Status: DC
  Filled 2017-03-28: qty 0.5

## 2017-03-28 MED ORDER — PRENATAL MULTIVITAMIN CH: 1.0000 | ORAL_TABLET | Freq: Every day | ORAL | Status: DC

## 2017-03-28 MED ORDER — OXYTOCIN 40 UNITS IN LACTATED RINGERS INFUSION - SIMPLE MED: 2.5000 [IU]/h | INTRAVENOUS | Status: DC | PRN

## 2017-03-28 MED ORDER — IBUPROFEN 600 MG PO TABS
600.0000 mg | ORAL_TABLET | Freq: Four times a day (QID) | ORAL | Status: DC
  Administered 2017-03-28: 600 mg via ORAL

## 2017-03-28 MED ORDER — IBUPROFEN 600 MG PO TABS
ORAL_TABLET | ORAL | Status: AC
  Filled 2017-03-28: qty 1

## 2017-03-28 MED ORDER — ZOLPIDEM TARTRATE 5 MG PO TABS: 5.0000 mg | ORAL_TABLET | Freq: Every evening | ORAL | Status: DC | PRN

## 2017-03-28 MED ORDER — ACETAMINOPHEN 325 MG PO TABS: 650.0000 mg | ORAL_TABLET | ORAL | Status: DC | PRN

## 2017-03-28 MED ORDER — BENZOCAINE-MENTHOL 20-0.5 % EX AERO: 1.0000 "application " | INHALATION_SPRAY | CUTANEOUS | Status: DC | PRN

## 2017-03-28 NOTE — Progress Notes (Signed)
Patient discharged home via wheelchair in stable condition accompanied by me and her husband after verbalizing understanding of all written discharge instructions.  AVS provided to patient.

## 2017-03-28 NOTE — Discharge Summary (Signed)
                              Discharge Summary  Date of Admission: 03/27/2017  Date of Discharge: 03/28/2017  Admitting Diagnosis: IUFD @ 25 weeks at 7529w5d  Mode of Delivery: Cytotec breech vaginal delivery                 Discharge Diagnosis: s/p vaginal delivery IUFD   Intrapartum Procedures: epidural   Post partum procedures:   Complications:                       Discharge Day SOAP Note:  Progress Note - Vaginal Delivery  Sheri Watkins is a 32 y.o. [id number] now PP day 1 s/p Vaginal, Breech . Delivery was complicated by IUFD with demise at @ 22 weeks  Subjective  The patient has the following complaints: has no unusual complaints  Pain is controlled with current medications.   Patient is urinating without difficulty.  She is ambulating well.    Objective  Vital signs: BP 115/81   Pulse 84   Temp (!) 97 F (36.1 C) (Oral)   Resp 18   Ht 5\' 6"  (1.676 m)   Wt 175 lb (79.4 kg)   LMP 09/06/2016 (Exact Date)   SpO2 100%   BMI 28.25 kg/m   Physical Exam: Gen: NAD Fundus Fundal Tone: Firm  Lochia Amount: Scant  Perineum       Data Review Labs: CBC Latest Ref Rng & Units 03/27/2017 11/25/2016 11/07/2016  WBC 3.6 - 11.0 K/uL 11.2(H) 9.6 9.6  Hemoglobin 12.0 - 16.0 g/dL 11.2(L) 11.7 13.4  Hematocrit 35.0 - 47.0 % 32.9(L) 35.4 37.7  Platelets 150 - 440 K/uL 231 325 271   A POS  Assessment/Plan  Active Problems:   Fetal demise, greater than 22 weeks, antepartum, fetus 1    Plan for discharge today.   Discharge Instructions: Per After Visit Summary. Activity: Advance as tolerated. Pelvic rest for 6 weeks.  Also refer to After Visit Summary Diet: Regular Medications: Allergies as of 03/28/2017   No Known Allergies     Medication List    STOP taking these medications   aspirin EC 81 MG tablet   doxylamine (Sleep) 25 MG tablet Commonly known as:  UNISOM   IRON-150 PO   pyridOXINE 100 MG tablet Commonly known as:  VITAMIN B-6     TAKE these  medications   docusate sodium 100 MG capsule Commonly known as:  COLACE Take 100 mg 2 (two) times daily by mouth.   multivitamin-prenatal 27-0.8 MG Tabs tablet Take 1 tablet by mouth daily at 12 noon.   polyethylene glycol packet Commonly known as:  MIRALAX / GLYCOLAX Take 17 g by mouth daily.      Outpatient follow up:  Follow-up Information    Linzie CollinEvans, David James, MD Follow up in 2 week(s).   Specialty:  Obstetrics and Gynecology Contact information: [address] [place] [address] [phone]          Postpartum contraception: Will discuss at first office visit post-partum  Discharged Condition: good  Discharged to: home  Newborn Data: Disposition:morgue  Apgars: APGAR (1 MIN): 0   APGAR (5 MINS): 0   APGAR (10 MINS):     Elonda Huskyavid J. Evans, M.D. 03/28/2017 8:38 AM

## 2017-03-28 NOTE — Anesthesia Postprocedure Evaluation (Signed)
Anesthesia Post Note  Patient: Sheri Watkins  Procedure(s) Performed: AN AD HOC LABOR EPIDURAL  Patient location during evaluation: L&D Anesthesia Type: Epidural Level of consciousness: awake and alert and oriented Pain management: pain level controlled Vital Signs Assessment: post-procedure vital signs reviewed and stable Respiratory status: respiratory function stable Cardiovascular status: stable Postop Assessment: epidural receding, no headache, no backache, no apparent nausea or vomiting, adequate PO intake and patient able to bend at knees Anesthetic complications: no     Last Vitals:  Vitals:   03/28/17 0243 03/28/17 0438  BP: (!) 94/57 115/81  Pulse: 99 84  Resp: 18 18  Temp: 36.6 C (!) 36.1 C  SpO2:      Last Pain:  Vitals:   03/28/17 0438  TempSrc: Oral  PainSc:                  Clydene PughBeane, Kourtnee Lahey D

## 2017-03-31 LAB — SURGICAL PATHOLOGY

## 2017-04-03 ENCOUNTER — Encounter: Payer: Medicaid Other | Admitting: Obstetrics and Gynecology

## 2017-04-04 ENCOUNTER — Other Ambulatory Visit: Payer: Medicaid Other

## 2017-04-08 ENCOUNTER — Encounter: Payer: Self-pay | Admitting: Obstetrics and Gynecology

## 2017-04-08 ENCOUNTER — Ambulatory Visit (INDEPENDENT_AMBULATORY_CARE_PROVIDER_SITE_OTHER): Payer: Medicaid Other | Admitting: Obstetrics and Gynecology

## 2017-04-08 DIAGNOSIS — N3001 Acute cystitis with hematuria: Secondary | ICD-10-CM

## 2017-04-08 MED ORDER — NITROFURANTOIN MONOHYD MACRO 100 MG PO CAPS: 100.0000 mg | ORAL_CAPSULE | Freq: Two times a day (BID) | ORAL | 1 refills | Status: DC

## 2017-04-08 NOTE — Progress Notes (Addendum)
HPI:      [name] is a 32 y.o. [phone] who LMP was Patient's last menstrual period was 09/06/2016 (exact date).  Subjective:   She presents today for early follow-up after IUFD and subsequent delivery.  She reports that she and her husband are doing well.  She does complain of some midline abdominal pain and pain with urination.  She reports that she continues to have some bleeding but it is small amounts and dark in color.    Hx: The following portions of the patient's history were reviewed and updated as appropriate:             She  has a past medical history of Acute blood loss anemia (11/28/2014), Maternal iron deficiency anemia (11/28/2014), Medical history non-contributory, and Postpartum care following vaginal delivery (9/18) (11/28/2014). She does not have any pertinent problems on file. She  has a past surgical history that includes No past surgeries and Dilation and curettage of uterus. Her family history includes Diabetes in her mother. She  reports that  has never smoked. she has never used smokeless tobacco. She reports that she does not drink alcohol or use drugs. She has No Known Allergies.       Review of Systems:  Review of Systems  Constitutional: Denied constitutional symptoms, night sweats, recent illness, fatigue, fever, insomnia and weight loss.  Eyes: Denied eye symptoms, eye pain, photophobia, vision change and visual disturbance.  Ears/Nose/Throat/Neck: Denied ear, nose, throat or neck symptoms, hearing loss, nasal discharge, sinus congestion and sore throat.  Cardiovascular: Denied cardiovascular symptoms, arrhythmia, chest pain/pressure, edema, exercise intolerance, orthopnea and palpitations.  Respiratory: Denied pulmonary symptoms, asthma, pleuritic pain, productive sputum, cough, dyspnea and wheezing.  Gastrointestinal: Denied, gastro-esophageal reflux, melena, nausea and vomiting.  Genitourinary: Denied genitourinary symptoms including symptomatic  vaginal discharge, pelvic relaxation issues, and urinary complaints.  Musculoskeletal: Denied musculoskeletal symptoms, stiffness, swelling, muscle weakness and myalgia.  Dermatologic: Denied dermatology symptoms, rash and scar.  Neurologic: Denied neurology symptoms, dizziness, headache, neck pain and syncope.  Psychiatric: Denied psychiatric symptoms, anxiety and depression.  Endocrine: Denied endocrine symptoms including hot flashes and night sweats.   Meds:   Current Outpatient Medications on File Prior to Visit  Medication Sig Dispense Refill  . ferrous sulfate 325 (65 FE) MG tablet Take 325 mg by mouth daily with breakfast.    . Prenatal Vit-Fe Fumarate-FA (MULTIVITAMIN-PRENATAL) 27-0.8 MG TABS tablet Take 1 tablet by mouth daily at 12 noon.    . docusate sodium (COLACE) 100 MG capsule Take 100 mg 2 (two) times daily by mouth.    . polyethylene glycol (MIRALAX / GLYCOLAX) packet Take 17 g by mouth daily. (Patient not taking: Reported on 03/27/2017) 14 each 0   No current facility-administered medications on file prior to visit.     Objective:     Vitals:   04/08/17 1336  BP: 138/84  Pulse: 90              UA performed-large leukocytes and some blood  Assessment:    A5W0981 Patient Active Problem List   Diagnosis Date Noted  . Fetal demise, greater than 22 weeks, antepartum, fetus 1 03/27/2017  . Fetal demise before 22 weeks with retention of dead fetus   . Fetal hydrops   . Pregnancy 02/25/2017  . Overweight 03/14/2016  . Irregular menses 03/14/2016  . History of iron deficiency anemia 11/28/2014  . History of pre-eclampsia in prior pregnancy, currently pregnant 11/26/2014     1. Postpartum  care and examination immediately after delivery   2. Acute cystitis with hematuria     UA and symptom profile consistent with urinary tract infection.  Patient otherwise well status post IUFD and second trimester loss.   Plan:            1.  Macrobid for UTI  2.   Follow-up in 5 weeks for postpartum check. Orders No orders of the defined types were placed in this encounter.    Meds ordered this encounter  Medications  . nitrofurantoin, macrocrystal-monohydrate, (MACROBID) 100 MG capsule    Sig: Take 1 capsule (100 mg total) by mouth 2 (two) times daily.    Dispense:  14 capsule    Refill:  1      F/U  Return in about 5 weeks (around 05/13/2017).  Elonda Huskyavid J. Evans, M.D. 04/08/2017 2:11 PM

## 2017-04-08 NOTE — Progress Notes (Signed)
Patient comes in today for a follow up.

## 2017-04-14 ENCOUNTER — Telehealth: Payer: Self-pay | Admitting: Obstetrics and Gynecology

## 2017-04-14 LAB — POC/TISSUE MICROARRAY

## 2017-04-14 NOTE — Telephone Encounter (Signed)
I called [name] to let she and her husband know that the results of the POC chromosomal microarray are now available from her recent pregnancy loss on 03/27/2017.  At that time, [facility] was [facility] [place] which revealed fetal demise with fetal hydrops at [redacted] weeks gestation (see notes). It appeared that the fetus had passed away a couple of weeks prior to that visit. Testing for infectious causes as well as chromosomal microarray were recommended and ordered by the physicians on Labor and Delivery.  An autopsy was also recommended to help assess for possible causes including structural anomalies which may have contributed to the loss.  The couple declined autopsy due to the cost.    Results of the chromosomal microarray are normal female.  No significant changes in the 2.695 million region specific SNP and structural targets were detected within the thresholds and specifications of this testing.  No regions of homozygosity were identified, and no admixture of fetal and maternal DNA was noted.  This means that there were no large regions of chromosomal deletions or duplications that would explain the findings in this pregnancy.  A review of other testing ordered including infectious studies and placental pathology shows no clear etiology for this loss.  We did offer additional genetic testing through the [facility] non-immune hydrops gene panel.  This testing assesses for variations in 87 genes known to be responsible for non-immune hydrops in some cases.  The testing has a "hit rate" for locating a genetic cause in approximately 20% of cases based upon communication with the laboratory.  The cost of testing is $3500 with a 5 week turn around time.  Due to the cost of the testing, the couple declined this testing.  In any future pregnancy, we would recommend an MFM consultation early in pregnancy to establish a plan of care.  We may be reached at  [phone].  Sheri Andersoneborah F. Marzell Isakson, MS, CGC

## 2017-04-18 ENCOUNTER — Telehealth: Payer: Self-pay | Admitting: Obstetrics and Gynecology

## 2017-04-18 NOTE — Telephone Encounter (Signed)
The patients husband called and stated that he would like for a nurse to give him a call back in regards to the patient having a bladder infection. No other information was disclosed. Please advise.

## 2017-04-21 NOTE — Telephone Encounter (Signed)
vm not set up yet. Will try later.

## 2017-04-22 NOTE — Telephone Encounter (Signed)
Pts husband states on Friday the pt was having mild discomfort with urination. She recently completed macrobid x 7 days. Today she is not having any symptoms. Pts husband states she has belly issues. He requested an u/s. Advised provider will order a scan if needed. Husband states they will f/u at [place].

## 2017-05-01 ENCOUNTER — Telehealth: Payer: Self-pay | Admitting: Obstetrics and Gynecology

## 2017-05-01 NOTE — Telephone Encounter (Signed)
Patient called stating she is experiencing vaginal itching,pain and burning with urination as well as white smelly discharge. Please Advise

## 2017-05-05 NOTE — Telephone Encounter (Signed)
Pt was called back voicemail came on but was not set up to leave messages.

## 2017-05-07 NOTE — Telephone Encounter (Signed)
Pt was called back no answer was unable to leave message due to the pt has not set up voicemail.

## 2017-05-13 ENCOUNTER — Encounter: Payer: Medicaid Other | Admitting: Obstetrics and Gynecology

## 2017-05-29 NOTE — Telephone Encounter (Signed)
Error

## 2017-06-03 ENCOUNTER — Ambulatory Visit (INDEPENDENT_AMBULATORY_CARE_PROVIDER_SITE_OTHER): Payer: Medicaid Other | Admitting: Obstetrics and Gynecology

## 2017-06-03 ENCOUNTER — Encounter: Payer: Self-pay | Admitting: Obstetrics and Gynecology

## 2017-06-03 VITALS — BP 116/72 | HR 79 | Ht 66.0 in | Wt 169.9 lb

## 2017-06-03 DIAGNOSIS — N912 Amenorrhea, unspecified: Secondary | ICD-10-CM

## 2017-06-03 LAB — POCT URINE PREGNANCY: Preg Test, Ur: NEGATIVE

## 2017-06-03 NOTE — Progress Notes (Signed)
HPI:      [name] is a 32 y.o. [phone] who LMP was Patient's last menstrual period was 05/25/2017.  Subjective:   She presents today after missing 2 menstrual periods and feeling as if she may be pregnant.  She has been having unprotected intercourse.  She is taking prenatal vitamins. (Recent history includes fetal loss at approximately 22 weeks) Patient desires pregnancy.    Hx: The following portions of the patient's history were reviewed and updated as appropriate:             She  has a past medical history of Acute blood loss anemia (11/28/2014), Maternal iron deficiency anemia (11/28/2014), Medical history non-contributory, and Postpartum care following vaginal delivery (9/18) (11/28/2014). She does not have any pertinent problems on file. She  has a past surgical history that includes No past surgeries and Dilation and curettage of uterus. Her family history includes Diabetes in her mother. She  reports that she has never smoked. She has never used smokeless tobacco. She reports that she does not drink alcohol or use drugs. She has a current medication list which includes the following prescription(s): multivitamin-prenatal. She has No Known Allergies.       Review of Systems:  Review of Systems  Constitutional: Denied constitutional symptoms, night sweats, recent illness, fatigue, fever, insomnia and weight loss.  Eyes: Denied eye symptoms, eye pain, photophobia, vision change and visual disturbance.  Ears/Nose/Throat/Neck: Denied ear, nose, throat or neck symptoms, hearing loss, nasal discharge, sinus congestion and sore throat.  Cardiovascular: Denied cardiovascular symptoms, arrhythmia, chest pain/pressure, edema, exercise intolerance, orthopnea and palpitations.  Respiratory: Denied pulmonary symptoms, asthma, pleuritic pain, productive sputum, cough, dyspnea and wheezing.  Gastrointestinal: Denied, gastro-esophageal reflux, melena, nausea and vomiting.  Genitourinary:  Denied genitourinary symptoms including symptomatic vaginal discharge, pelvic relaxation issues, and urinary complaints.  Musculoskeletal: Denied musculoskeletal symptoms, stiffness, swelling, muscle weakness and myalgia.  Dermatologic: Denied dermatology symptoms, rash and scar.  Neurologic: Denied neurology symptoms, dizziness, headache, neck pain and syncope.  Psychiatric: Denied psychiatric symptoms, anxiety and depression.  Endocrine: Denied endocrine symptoms including hot flashes and night sweats.   Meds:   Current Outpatient Medications on File Prior to Visit  Medication Sig Dispense Refill  . Prenatal Vit-Fe Fumarate-FA (MULTIVITAMIN-PRENATAL) 27-0.8 MG TABS tablet Take 1 tablet by mouth daily at 12 noon.     No current facility-administered medications on file prior to visit.     Objective:     Vitals:   06/03/17 1105  BP: 116/72  Pulse: 79              Urinary beta-hCG NEGATIVE  Assessment:    [id number] Patient Active Problem List   Diagnosis Date Noted  . Fetal demise, greater than 22 weeks, antepartum, fetus 1 03/27/2017  . Fetal demise before 22 weeks with retention of dead fetus   . Fetal hydrops   . Pregnancy 02/25/2017  . Overweight 03/14/2016  . Irregular menses 03/14/2016  . History of iron deficiency anemia 11/28/2014  . History of pre-eclampsia in prior pregnancy, currently pregnant 11/26/2014     1. Amenorrhea     Negative pregnancy test   Plan:            1.  I have reassured her and discussed her negative pregnancy test.  I advised her to continue prenatal vitamins.  We have decided that the best time for her to attempt pregnancy is approximately 5-6 months postpartum from her loss.  She agrees with this  plan.  If she does not have a menstrual period in the next 4 weeks I have advised her to contact us and we will consider use of progesterone to induce withdrawal bleeding. Orders Orders Placed This Encounter  Procedures  . POCT urine  pregnancy    No orders of the defined types were placed in this encounter.     F/U  Return in about 3 months (around 09/03/2017). I spent 16 minutes with this patient of which greater than 50% was spent discussing negative pregnancy test, irregular menstrual cycles, string pregnancy loss and its implications regarding future pregnancy, timing of next pregnancy.  Elonda Husky, M.D. 06/03/2017 11:30 AM

## 2017-12-03 ENCOUNTER — Other Ambulatory Visit: Payer: Self-pay | Admitting: Obstetrics & Gynecology

## 2017-12-03 DIAGNOSIS — R7989 Other specified abnormal findings of blood chemistry: Secondary | ICD-10-CM

## 2017-12-03 DIAGNOSIS — R945 Abnormal results of liver function studies: Principal | ICD-10-CM

## 2017-12-08 ENCOUNTER — Other Ambulatory Visit: Payer: Medicaid Other

## 2017-12-15 ENCOUNTER — Ambulatory Visit
Admission: RE | Admit: 2017-12-15 | Discharge: 2017-12-15 | Disposition: A | Discharge status: Home / Self Care | Payer: Medicaid Other | Source: Ambulatory Visit | Attending: Obstetrics & Gynecology | Admitting: Obstetrics & Gynecology

## 2017-12-15 DIAGNOSIS — R7989 Other specified abnormal findings of blood chemistry: Secondary | ICD-10-CM

## 2017-12-15 DIAGNOSIS — R945 Abnormal results of liver function studies: Principal | ICD-10-CM

## 2018-01-21 LAB — OB RESULTS CONSOLE ABO/RH: RH Type: POSITIVE

## 2018-01-21 LAB — OB RESULTS CONSOLE GC/CHLAMYDIA
Chlamydia: NEGATIVE
Gonorrhea: NEGATIVE

## 2018-01-21 LAB — OB RESULTS CONSOLE HEPATITIS B SURFACE ANTIGEN: Hepatitis B Surface Ag: NEGATIVE

## 2018-01-21 LAB — OB RESULTS CONSOLE ANTIBODY SCREEN: Antibody Screen: NEGATIVE

## 2018-01-21 LAB — OB RESULTS CONSOLE HIV ANTIBODY (ROUTINE TESTING): HIV: NONREACTIVE

## 2018-01-21 LAB — OB RESULTS CONSOLE RPR: RPR: NONREACTIVE

## 2018-01-21 LAB — OB RESULTS CONSOLE RUBELLA ANTIBODY, IGM: Rubella: IMMUNE

## 2018-01-24 IMAGING — US US OB COMP LESS 14 WK
1 series · 14 of 28 positions shown · non-contrast
Comparison: None.

CLINICAL DATA: Left lower quadrant pain and bleeding x1 day

EXAM:
OBSTETRIC <14 WK US AND TRANSVAGINAL OB US
TECHNIQUE: Both transabdominal and transvaginal ultrasound examinations were
performed for complete evaluation of the gestation as well as the
maternal uterus, adnexal regions, and pelvic cul-de-sac.
Transvaginal technique was performed to assess early pregnancy.

[Series 1: us ob comp less 14 wk · 0.14mm/px · 110 acquisitions, 14 frames shown]
[im 5/110]
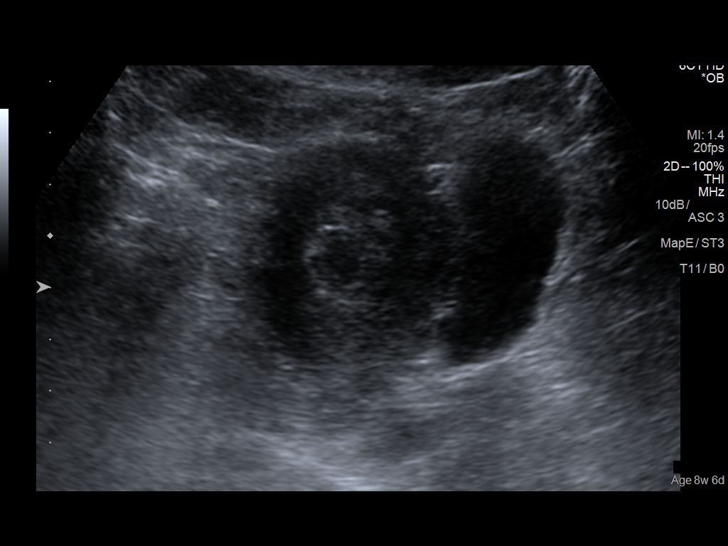
[im 13/110]
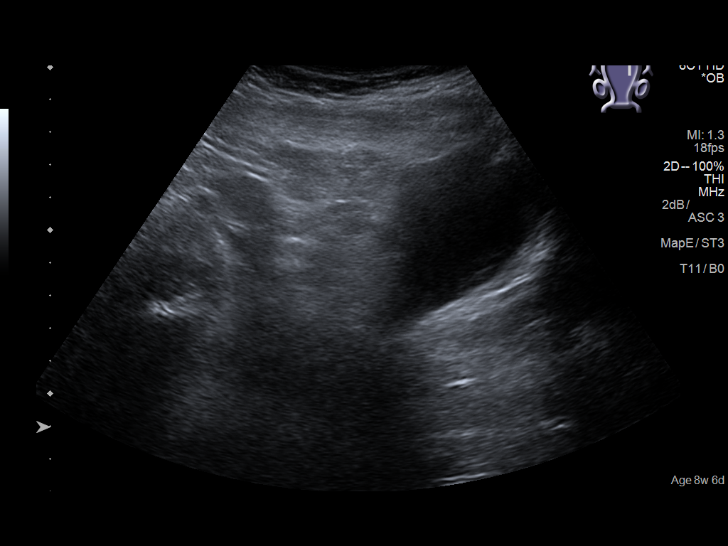
[im 21/110]
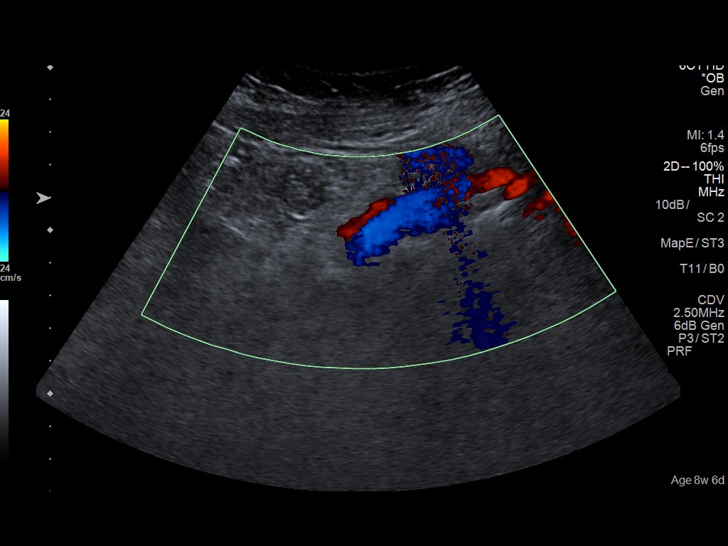
[im 29/110]
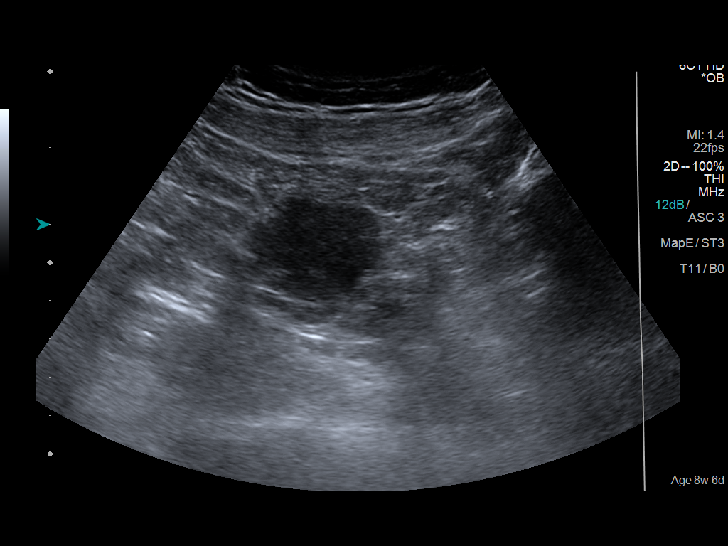
[im 37/110]
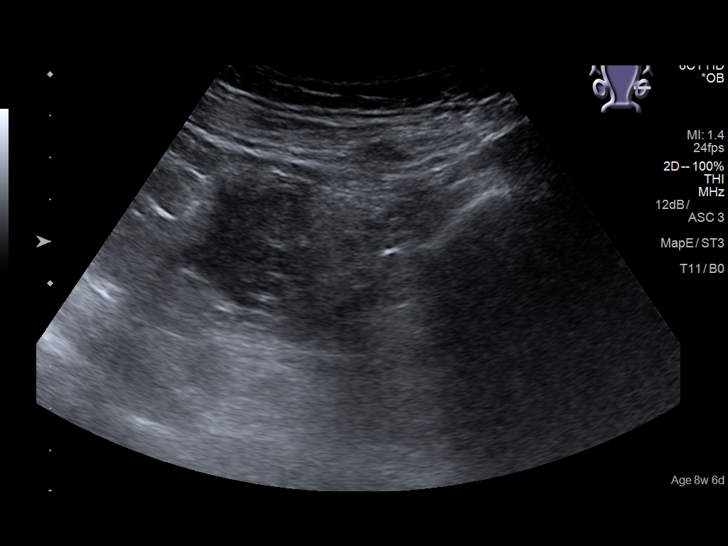
[im 45/110]
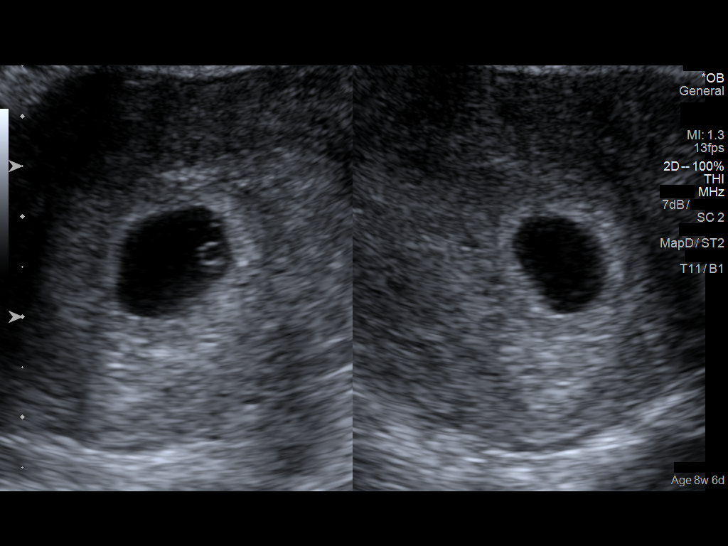
[im 53/110]
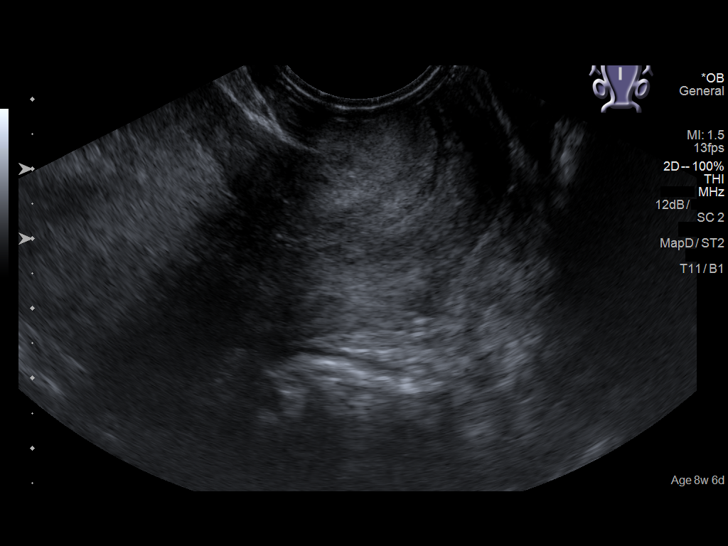
[im 61/110]
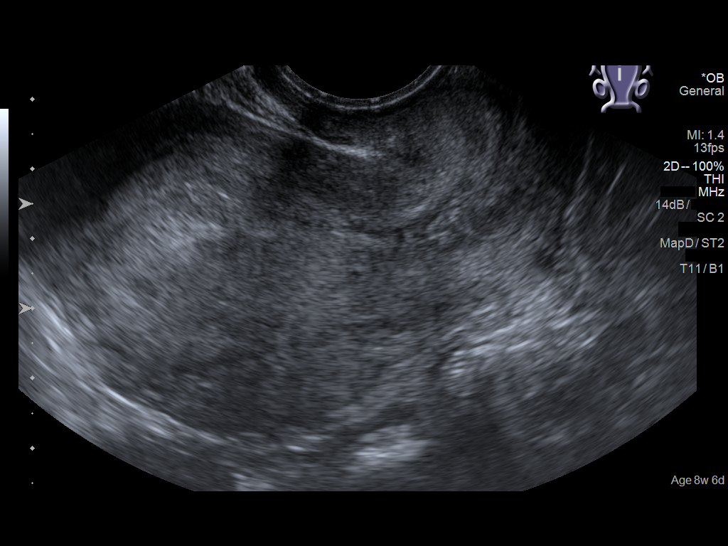
[im 69/110]
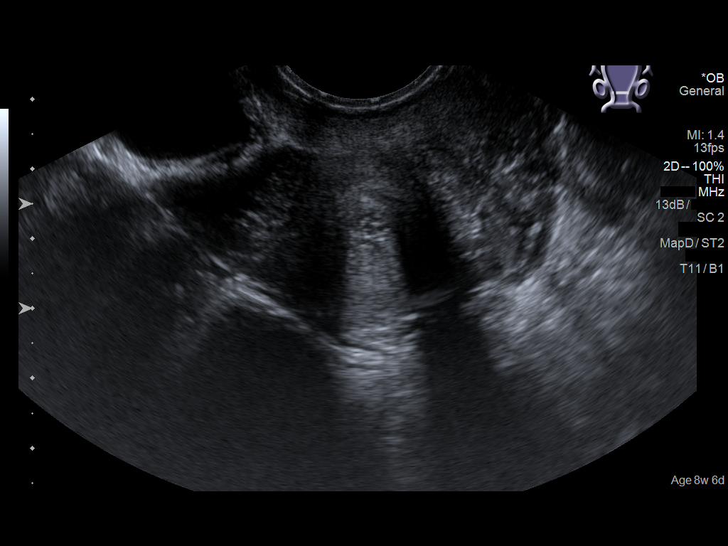
[im 77/110]
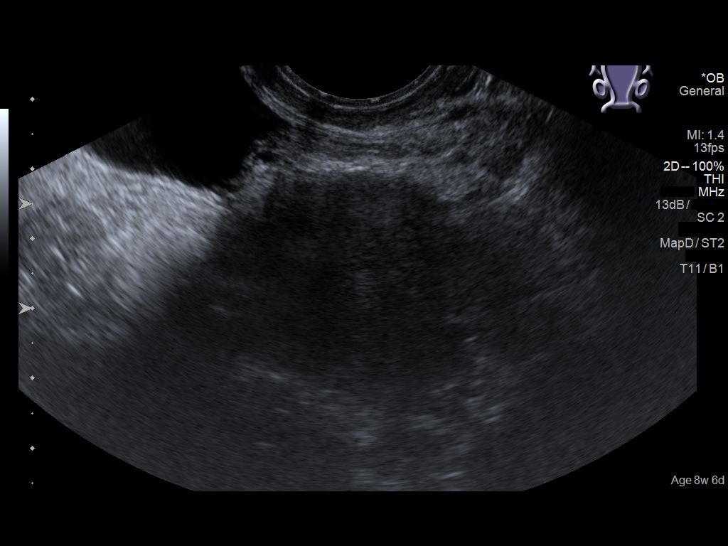
[im 85/110]
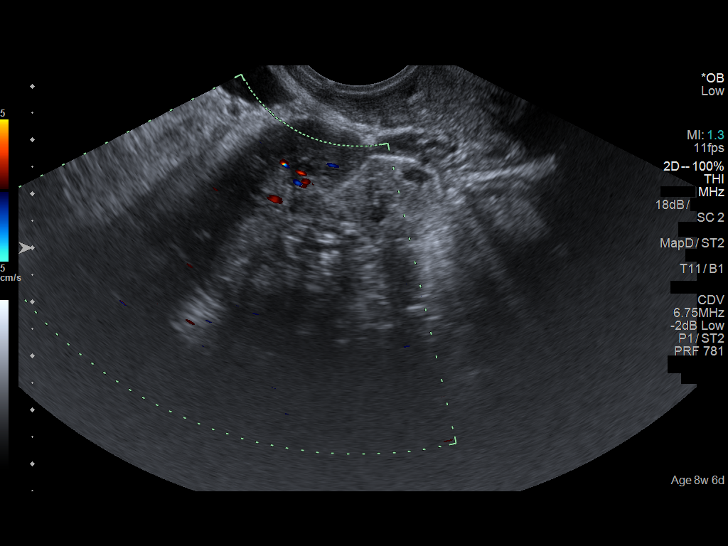
[im 93/110]
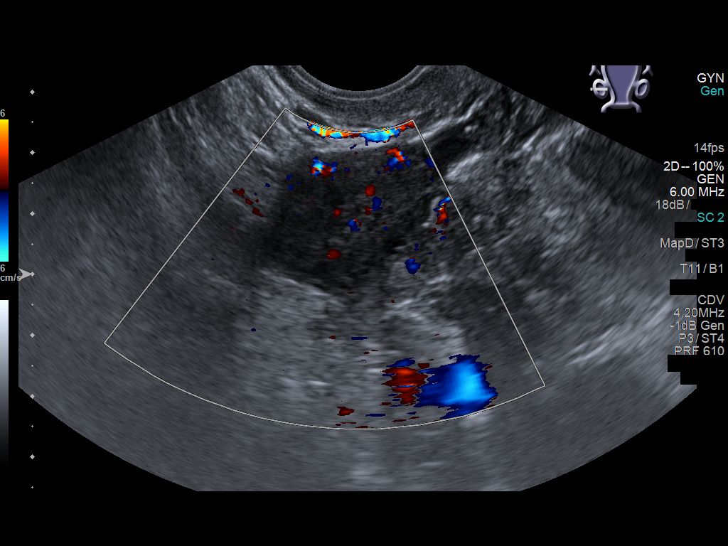
[im 101/110]
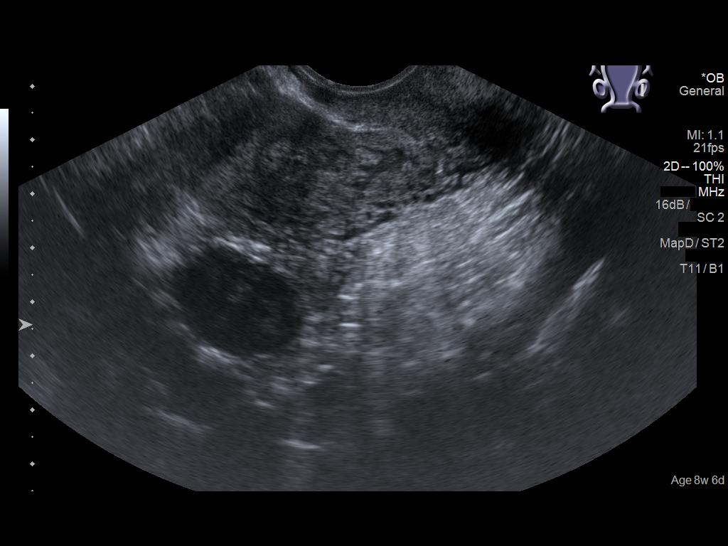
[im 110/110]
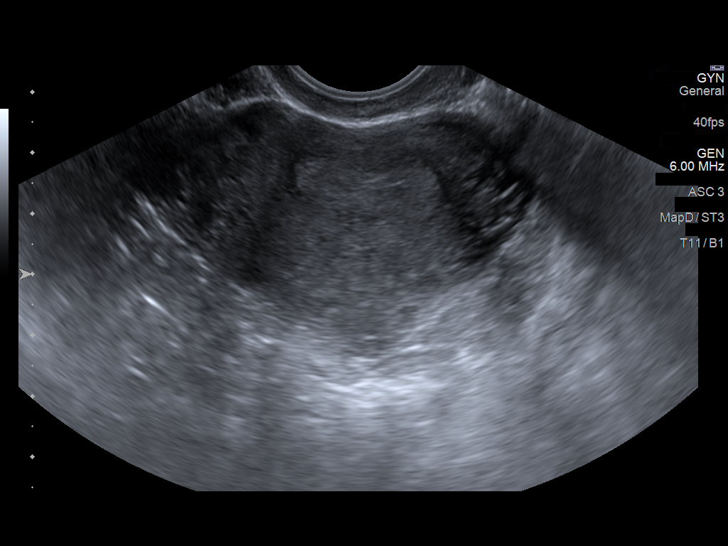

[14 of 28 positions shown; findings below may reference images not displayed]

FINDINGS: Intrauterine gestational sac: Single

Yolk sac:  Visualized

Embryo:  Not visualized

Cardiac Activity: Not Visualized.

Heart Rate: Not applicable

MSD: 10.2  mm   5 w   5  d

Subchorionic hemorrhage:  None visualized.

Maternal uterus/adnexae: Normal ovaries.
IMPRESSION: Probable early intrauterine gestational sac and yolk sac but no
fetal pole or cardiac activity yet visualized. Recommend follow-up
quantitative B-HCG levels and follow-up US in 14 days to assess
viability. This recommendation follows SRU consensus guidelines:
Diagnostic Criteria for Nonviable Pregnancy Early in the First
Trimester. N Engl J Med 8898; [DATE].

## 2018-01-31 IMAGING — US US OB TRANSVAGINAL
1 series · 14 of 28 positions shown · non-contrast
Comparison: 11/07/2016

CLINICAL DATA: Threatened miscarriage. Irregular menses and unknown
LMP. First trimester pregnancy with inconclusive fetal viability.

EXAM:
OBSTETRIC <14 WK US AND TRANSVAGINAL OB US
TECHNIQUE: Both transabdominal and transvaginal ultrasound examinations were
performed for complete evaluation of the gestation as well as the
maternal uterus, adnexal regions, and pelvic cul-de-sac.
Transvaginal technique was performed to assess early pregnancy.

[Series 1: us ob transvaginal · 0.13mm/px · 104 acquisitions, 14 frames shown]
[im 4/104]
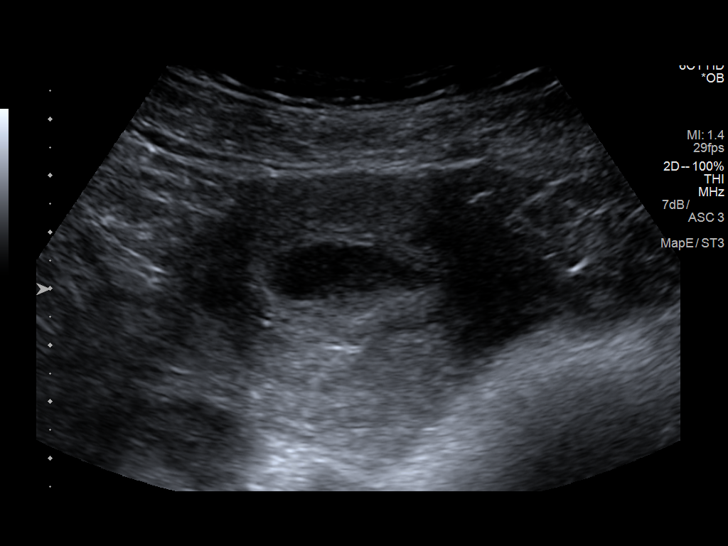
[im 12/104]
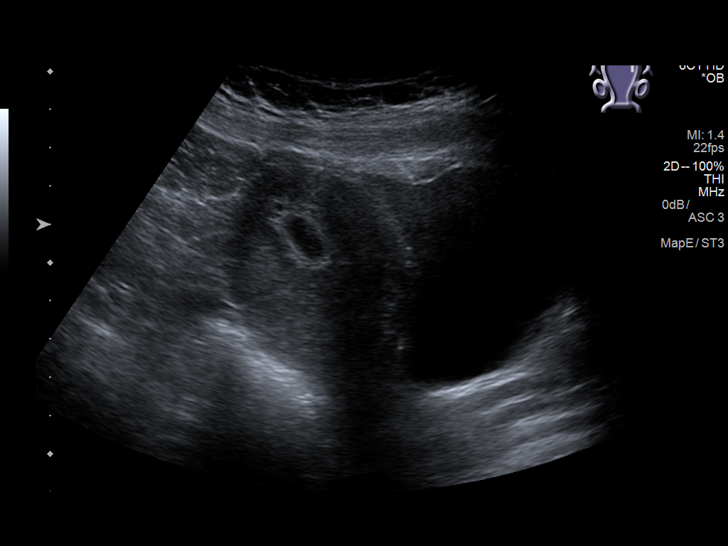
[im 20/104]
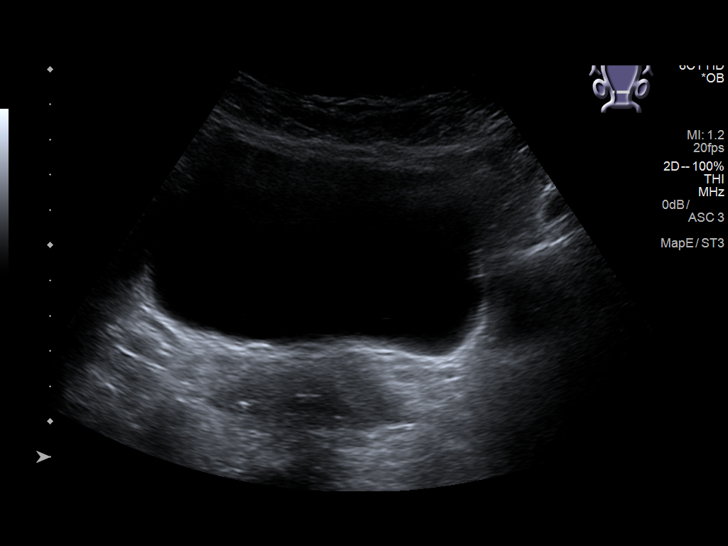
[im 27/104]
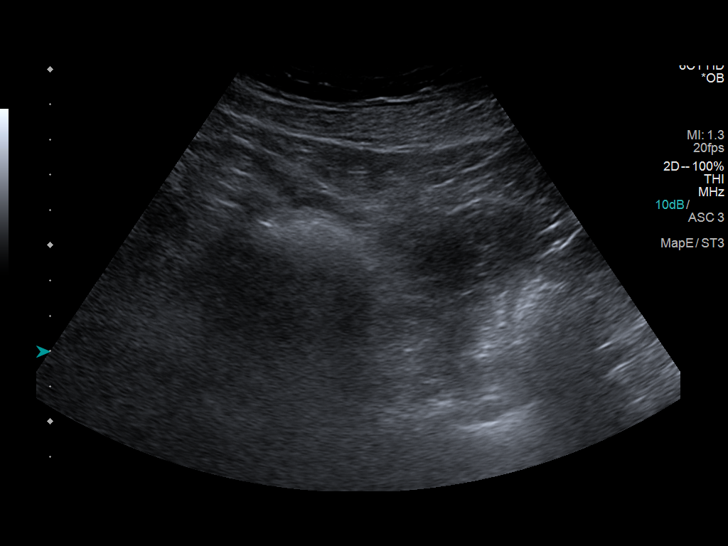
[im 35/104]
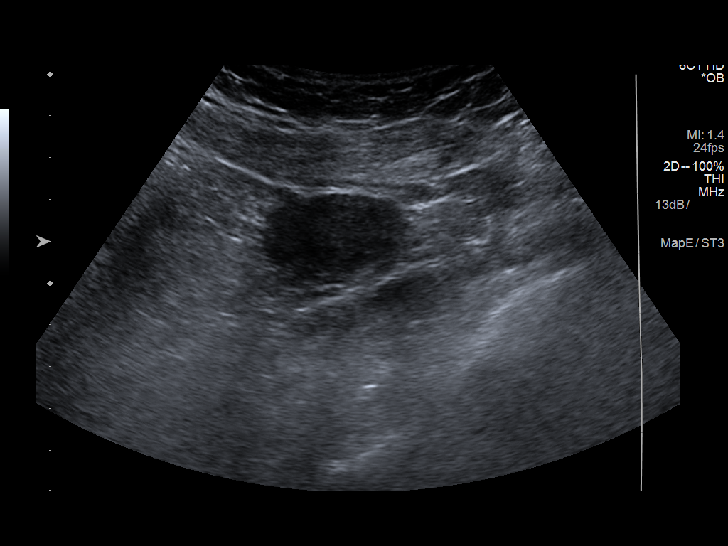
[im 42/104]
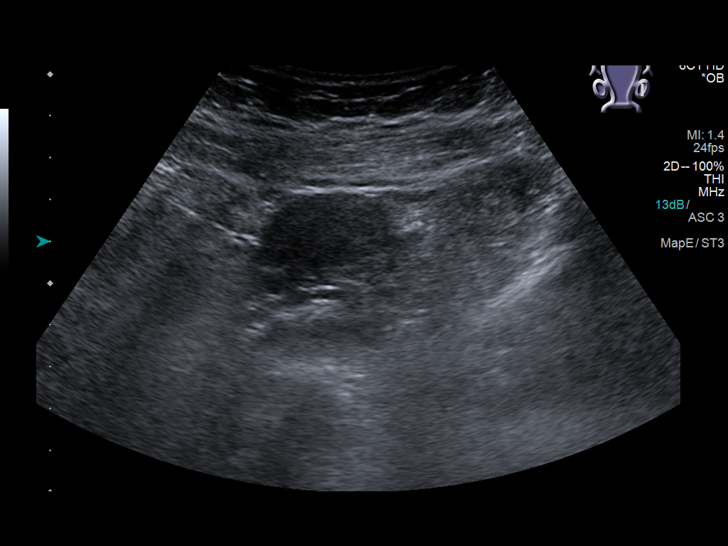
[im 50/104]
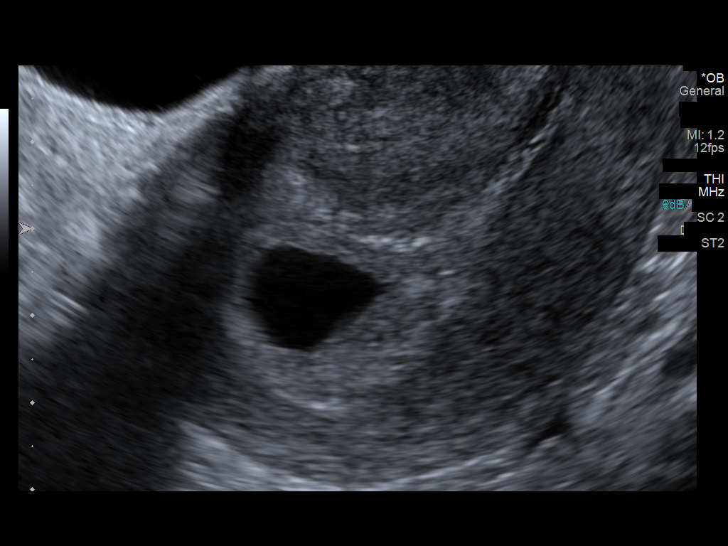
[im 58/104]
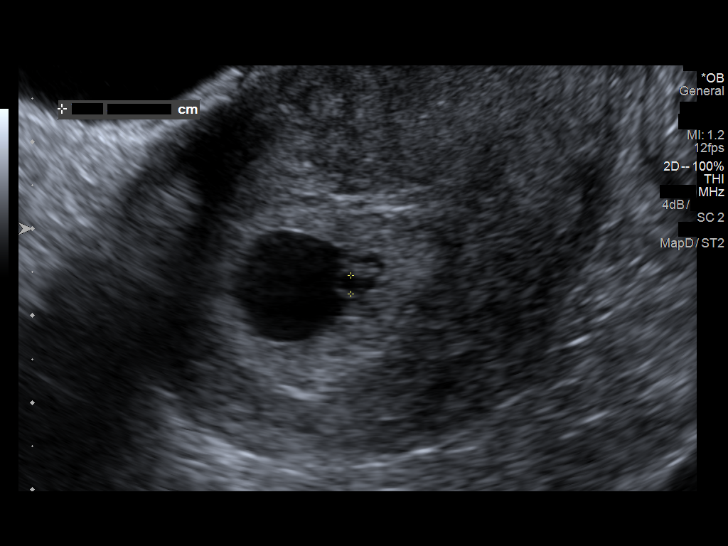
[im 65/104]
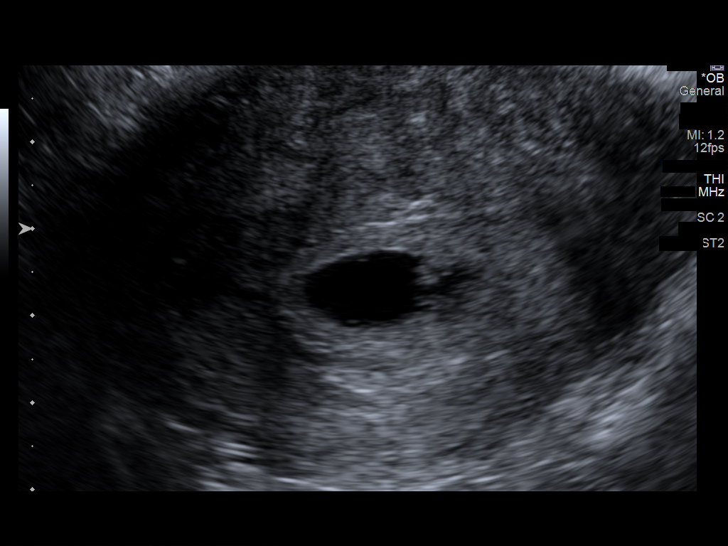
[im 73/104]
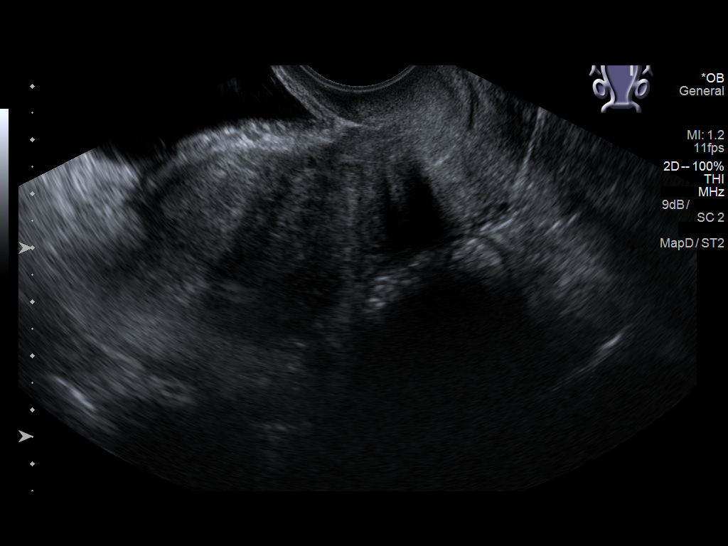
[im 81/104]
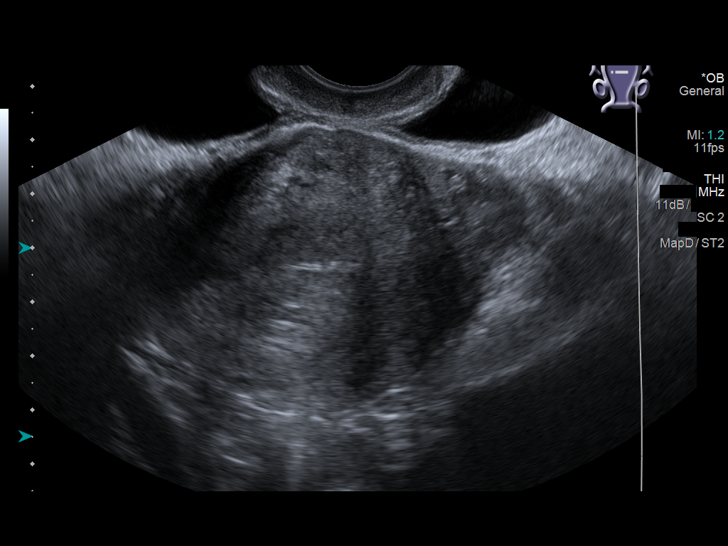
[im 88/104]
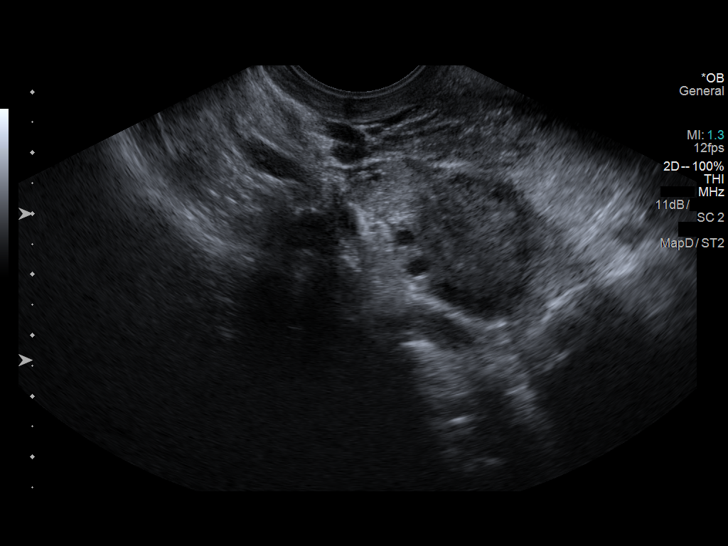
[im 96/104]
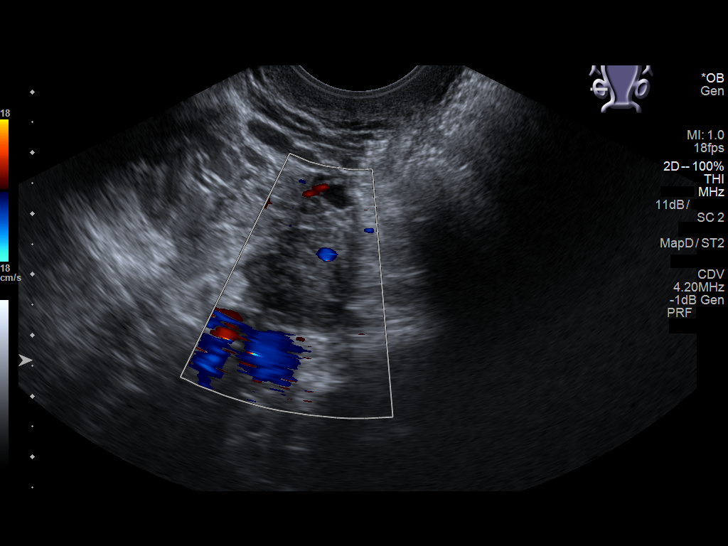
[im 104/104]
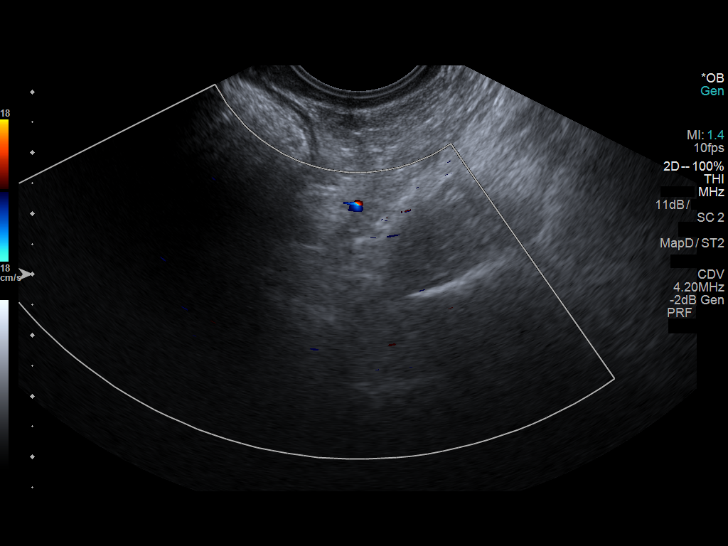

[14 of 28 positions shown; findings below may reference images not displayed]

FINDINGS: Intrauterine gestational sac: Single

Yolk sac:  Visualized.

Embryo:  Visualized.

Cardiac Activity: Visualized.

Heart Rate: 104  bpm

CRL:  4  mm   6 w   0 d                  US EDC: 07/10/2017

Subchorionic hemorrhage:  None visualized.

Maternal uterus/adnexae: Normal appearance of both ovaries. No
masses or abnormal free fluid identified .
IMPRESSION: Single living IUP measuring 6 weeks 0 days, with US EDC of
07/10/2017.

No significant maternal uterine or adnexal abnormality identified.

## 2018-03-11 NOTE — L&D Delivery Note (Signed)
Delivery Note At 4:27 PM a viable and healthy female was delivered via Vaginal, Spontaneous (Presentation:OA ).  APGAR: 9, 9; weight 8 lb 11.5 oz (3955 g).   Placenta status: spontaneous, complete.  Cord:  with the following complications: .  Cord pH: N/A   Anesthesia:  Epidural  Episiotomy: None Lacerations: 1st degree Suture Repair: 3.0 vicryl rapide Est. Blood Loss (mL): 250  Mom to postpartum.  Baby to Couplet care / Skin to Skin.  [name] 09/25/2018, 6:53 PM

## 2018-08-31 LAB — OB RESULTS CONSOLE GBS: GBS: NEGATIVE

## 2018-09-16 ENCOUNTER — Telehealth (HOSPITAL_COMMUNITY): Payer: Self-pay | Admitting: *Deleted

## 2018-09-16 ENCOUNTER — Encounter (HOSPITAL_COMMUNITY): Payer: Self-pay | Admitting: *Deleted

## 2018-09-16 NOTE — Telephone Encounter (Signed)
Preadmission screen  

## 2018-09-18 ENCOUNTER — Telehealth (HOSPITAL_COMMUNITY): Payer: Self-pay | Admitting: *Deleted

## 2018-09-18 NOTE — Telephone Encounter (Signed)
Preadmission screen  

## 2018-09-21 ENCOUNTER — Telehealth (HOSPITAL_COMMUNITY): Payer: Self-pay | Admitting: *Deleted

## 2018-09-21 NOTE — Telephone Encounter (Signed)
Preadmission screen  

## 2018-09-22 ENCOUNTER — Encounter (HOSPITAL_COMMUNITY): Payer: Self-pay

## 2018-09-22 ENCOUNTER — Inpatient Hospital Stay (EMERGENCY_DEPARTMENT_HOSPITAL)
Admission: AD | Admit: 2018-09-22 | Discharge: 2018-09-23 | Disposition: A | Discharge status: Home / Self Care | Payer: Managed Care, Other (non HMO) | Source: Home / Self Care | Attending: Obstetrics and Gynecology | Admitting: Obstetrics and Gynecology

## 2018-09-22 ENCOUNTER — Other Ambulatory Visit: Payer: Self-pay

## 2018-09-22 ENCOUNTER — Telehealth (HOSPITAL_COMMUNITY): Payer: Self-pay | Admitting: *Deleted

## 2018-09-22 DIAGNOSIS — Z3A39 39 weeks gestation of pregnancy: Secondary | ICD-10-CM

## 2018-09-22 DIAGNOSIS — O471 False labor at or after 37 completed weeks of gestation: Secondary | ICD-10-CM | POA: Insufficient documentation

## 2018-09-22 DIAGNOSIS — R030 Elevated blood-pressure reading, without diagnosis of hypertension: Secondary | ICD-10-CM

## 2018-09-22 DIAGNOSIS — O479 False labor, unspecified: Secondary | ICD-10-CM

## 2018-09-22 DIAGNOSIS — R519 Headache, unspecified: Secondary | ICD-10-CM

## 2018-09-22 DIAGNOSIS — O26893 Other specified pregnancy related conditions, third trimester: Secondary | ICD-10-CM

## 2018-09-22 DIAGNOSIS — R51 Headache: Secondary | ICD-10-CM | POA: Insufficient documentation

## 2018-09-22 DIAGNOSIS — O163 Unspecified maternal hypertension, third trimester: Secondary | ICD-10-CM | POA: Insufficient documentation

## 2018-09-22 NOTE — MAU Provider Note (Signed)
Chief Complaint:  Headache and Contractions   First Provider Initiated Contact with Patient 09/22/18 2346      HPI: Sheri Watkins is a 33 y.o. [id number] at 11w0dwho presents to maternity admissions reporting contractions and a headache.  Headache went away when she arrived here.  Had borderline elevated BP so I was asked to eval her. . She reports good fetal movement, denies LOF, vaginal bleeding, vaginal itching/burning, urinary symptoms, h/a, dizziness, n/v, diarrhea, constipation or fever/chills.  She denies visual changes or RUQ abdominal pain.  RN Note: Tummy gets tight then relaxes - been going on since morning but then at 9:30 pm felt hot and having a headache. Baby moving well. No bleeding. No leaking. 2 cm at last cervix check.   Past Medical History: Past Medical History:  Diagnosis Date  . Acute blood loss anemia 11/28/2014  . Maternal iron deficiency anemia 11/28/2014  . Medical history non-contributory   . PCOS (polycystic ovarian syndrome)   . Postpartum care following vaginal delivery (9/18) 11/28/2014    Past obstetric history: OB History  Gravida Para Term Preterm AB Living  4 2 1 1 1 1   SAB TAB Ectopic Multiple Live Births  1     0 1    # Outcome Date GA Lbr Len/2nd Weight Sex Delivery Anes PTL Lv  4 Current           3 Preterm 03/27/17 [redacted]w[redacted]d / 00:34 1155 g F Vag-Breech EPI  FD  2 Term 11/27/14 [redacted]w[redacted]d 03:04 / 04:23 3249 g F Vag-Spont Local  LIV  1 SAB             Obstetric Comments  25 week fetal demise    Past Surgical History: Past Surgical History:  Procedure Laterality Date  . DILATION AND CURETTAGE OF UTERUS    . NO PAST SURGERIES      Family History: Family History  Problem Relation Age of Onset  . Diabetes Mother   . Breast cancer Neg Hx   . Ovarian cancer Neg Hx   . Heart disease Neg Hx     Social History: Social History   Tobacco Use  . Smoking status: Never Smoker  . Smokeless tobacco: Never Used  Substance Use Topics  . Alcohol use:  No  . Drug use: No    Allergies: No Known Allergies  Meds:  No medications prior to admission.    I have reviewed patient's Past Medical Hx, Surgical Hx, Family Hx, Social Hx, medications and allergies.   ROS:  Review of Systems  Constitutional: Negative for chills and fever.  Eyes: Negative for visual disturbance.  Respiratory: Negative for shortness of breath.   Cardiovascular: Negative for leg swelling.  Gastrointestinal: Negative for abdominal pain, diarrhea, nausea and vomiting.  Neurological: Positive for headaches. Negative for dizziness, weakness and light-headedness.   Other systems negative  Physical Exam   Patient Vitals for the past 24 hrs:  BP Temp Temp src Pulse Resp SpO2 Height Weight  09/23/18 0113 126/80 97.6 F (36.4 C) Oral 88 16 - - -  09/23/18 0046 132/78 - - 86 - - - -  09/22/18 2345 - - - - - 100 % - -  09/22/18 2340 - - - - - 100 % - -  09/22/18 2339 134/80 - - 90 - - 5\' 6"  (1.676 m) 81.6 kg  09/22/18 2335 - - - - - 100 % - -  09/22/18 2330 - - - - - 100 % - -  09/22/18 2325 - - - - - 100 % - -  09/22/18 2322 135/84 98.5 F (36.9 C) Oral 90 16 - - -   Constitutional: Well-developed, well-nourished female in no acute distress.  Cardiovascular: normal rate and rhythm Respiratory: normal effort, clear to auscultation bilaterally GI: Abd soft, non-tender, gravid appropriate for gestational age.   No rebound or guarding. MS: Extremities nontender, no edema, normal ROM Neurologic: Alert and oriented x 4.  GU: Neg CVAT.  PELVIC EXAM:  Dilation: 2.5 Effacement (%): 60 Cervical Position: Posterior Station: -2 Presentation: Undeterminable Exam by:: benji stanley RN  FHT:  Baseline 140 , moderate variability, accelerations present, no decelerations Contractions:  Irregular and occasional    Labs: Results for orders placed or performed during the hospital encounter of 09/22/18 (from the past 24 hour(s))  Protein / creatinine ratio, urine      Status: None   Collection Time: 09/22/18 11:50 PM  Result Value Ref Range   Creatinine, Urine 17.50 mg/dL   Total Protein, Urine <6 mg/dL   Protein Creatinine Ratio        0.00 - 0.15 mg/mg[Cre]  CBC     Status: Abnormal   Collection Time: 09/23/18 12:02 AM  Result Value Ref Range   WBC 11.3 (H) 4.0 - 10.5 K/uL   RBC 4.15 3.87 - 5.11 MIL/uL   Hemoglobin 12.1 12.0 - 15.0 g/dL   HCT 29.536.0 28.436.0 - 13.246.0 %   MCV 86.7 80.0 - 100.0 fL   MCH 29.2 26.0 - 34.0 pg   MCHC 33.6 30.0 - 36.0 g/dL   RDW 44.014.6 10.211.5 - 72.515.5 %   Platelets 223 150 - 400 K/uL   nRBC 0.0 0.0 - 0.2 %  Comprehensive metabolic panel     Status: Abnormal   Collection Time: 09/23/18 12:02 AM  Result Value Ref Range   Sodium 134 (L) 135 - 145 mmol/L   Potassium 4.0 3.5 - 5.1 mmol/L   Chloride 105 98 - 111 mmol/L   CO2 18 (L) 22 - 32 mmol/L   Glucose, Bld 90 70 - 99 mg/dL   BUN 5 (L) 6 - 20 mg/dL   Creatinine, Ser 3.660.50 0.44 - 1.00 mg/dL   Calcium 44.010.2 8.9 - 34.710.3 mg/dL   Total Protein 6.9 6.5 - 8.1 g/dL   Albumin 3.2 (L) 3.5 - 5.0 g/dL   AST 14 (L) 15 - 41 U/L   ALT 12 0 - 44 U/L   Alkaline Phosphatase 144 (H) 38 - 126 U/L   Total Bilirubin 0.4 0.3 - 1.2 mg/dL   GFR calc non Af Amer >60 >60 mL/min   GFR calc Af Amer >60 >60 mL/min   Anion gap 11 5 - 15   A/Positive/-- (11/13 0000)  Imaging:  No results found.  MAU Course/MDM: I have ordered labs and reviewed results. No evidence for preeclampsia.  NST reviewed, reactive, category I  Treatments in MAU included EFM.    Assessment: 1. Borderline hypertension   2. Uterine contractions during pregnancy   3. Headache in pregnancy, antepartum, third trimester     Plan: Discharge home Labor precautions and fetal kick counts Preeclampsia precautions Follow up in Office for prenatal visits and recheck of symptoms  Follow-up Information    Shea EvansMody, Vaishali, MD Follow up.   Specialty: Obstetrics and Gynecology Contact information: [place] [place]  [id number] [phone]         Encouraged to return here or to other Urgent Care/ED if she develops worsening  of symptoms, increase in pain, fever, or other concerning symptoms.   Pt stable at time of discharge.  Wynelle BourgeoisMarie Jeremian Whitby CNM, MSN Certified Nurse-Midwife 09/23/2018 8:42 AM

## 2018-09-22 NOTE — MAU Note (Signed)
Tummy gets tight then relaxes - been going on since morning but then at 9:30 pm felt hot and having a headache. Baby moving well. No bleeding. No leaking. 2 cm at last cervix check.

## 2018-09-22 NOTE — Telephone Encounter (Signed)
Preadmission screen  

## 2018-09-23 ENCOUNTER — Telehealth (HOSPITAL_COMMUNITY): Payer: Self-pay | Admitting: *Deleted

## 2018-09-23 ENCOUNTER — Other Ambulatory Visit: Payer: Self-pay | Admitting: Obstetrics & Gynecology

## 2018-09-23 ENCOUNTER — Ambulatory Visit (HOSPITAL_COMMUNITY)
Admission: RE | Admit: 2018-09-23 | Discharge: 2018-09-23 | Disposition: A | Discharge status: Home / Self Care | Payer: Managed Care, Other (non HMO) | Source: Ambulatory Visit

## 2018-09-23 LAB — COMPREHENSIVE METABOLIC PANEL
ALT: 12 U/L (ref 0–44)
AST: 14 U/L — ABNORMAL LOW (ref 15–41)
Albumin: 3.2 g/dL — ABNORMAL LOW (ref 3.5–5.0)
Alkaline Phosphatase: 144 U/L — ABNORMAL HIGH (ref 38–126)
Anion gap: 11 (ref 5–15)
BUN: 5 mg/dL — ABNORMAL LOW (ref 6–20)
CO2: 18 mmol/L — ABNORMAL LOW (ref 22–32)
Calcium: 10.2 mg/dL (ref 8.9–10.3)
Chloride: 105 mmol/L (ref 98–111)
Creatinine, Ser: 0.5 mg/dL (ref 0.44–1.00)
GFR calc Af Amer: >60 mL/min (ref >60)
GFR calc non Af Amer: >60 mL/min (ref >60)
Glucose, Bld: 90 mg/dL (ref 70–99)
Potassium: 4 mmol/L (ref 3.5–5.1)
Sodium: 134 mmol/L — ABNORMAL LOW (ref 135–145)
Total Bilirubin: 0.4 mg/dL (ref 0.3–1.2)
Total Protein: 6.9 g/dL (ref 6.5–8.1)

## 2018-09-23 LAB — CBC
HCT: 36 % (ref 36.0–46.0)
Hemoglobin: 12.1 g/dL (ref 12.0–15.0)
MCH: 29.2 pg (ref 26.0–34.0)
MCHC: 33.6 g/dL (ref 30.0–36.0)
MCV: 86.7 fL (ref 80.0–100.0)
Platelets: 223 10*3/uL (ref 150–400)
RBC: 4.15 MIL/uL (ref 3.87–5.11)
RDW: 14.6 % (ref 11.5–15.5)
WBC: 11.3 10*3/uL — ABNORMAL HIGH (ref 4.0–10.5)
nRBC: 0 % (ref 0.0–0.2)

## 2018-09-23 LAB — PROTEIN / CREATININE RATIO, URINE
Creatinine, Urine: 17.5 mg/dL
Total Protein, Urine: <6 mg/dL

## 2018-09-23 NOTE — MAU Note (Signed)
I have communicated with Hansel Feinstein CNM and reviewed vital signs:  Vitals:   09/23/18 0046 09/23/18 0113  BP: 132/78 126/80  Pulse: 86 88  Resp:  16  Temp:  97.6 F (36.4 C)  SpO2:      Vaginal exam:  Dilation: 2.5 Effacement (%): 60 Cervical Position: Posterior Station: -2 Presentation: Undeterminable Exam by:: Zakaria Fromer Production designer, theatre/television/film,   Also reviewed contraction pattern and that non-stress test is reactive.  It has been documented that patient is contracting every 2-3 minutes with no cervical change over 1.5 hours not indicating active labor.  Pt rates her pain level as "3" initially and is still a "3" at discharge. Patient denies any other complaints.  Based on this report provider has given order for discharge.  A discharge order and diagnosis entered by a provider.  Labor discharge instructions reviewed with patient.

## 2018-09-23 NOTE — Discharge Instructions (Signed)

## 2018-09-23 NOTE — Telephone Encounter (Signed)
Instructed pt to come in for her induction and covid test would be done then.

## 2018-09-25 ENCOUNTER — Inpatient Hospital Stay (HOSPITAL_COMMUNITY): Payer: Managed Care, Other (non HMO) | Admitting: Anesthesiology

## 2018-09-25 ENCOUNTER — Inpatient Hospital Stay (HOSPITAL_COMMUNITY)
Admission: AD | Admit: 2018-09-25 | Discharge: 2018-09-27 | DRG: 806 | Disposition: A | Discharge status: Home / Self Care | Payer: Managed Care, Other (non HMO) | Attending: Obstetrics & Gynecology | Admitting: Obstetrics & Gynecology

## 2018-09-25 ENCOUNTER — Inpatient Hospital Stay (HOSPITAL_COMMUNITY): Payer: Managed Care, Other (non HMO)

## 2018-09-25 ENCOUNTER — Encounter (HOSPITAL_COMMUNITY): Payer: Self-pay | Admitting: *Deleted

## 2018-09-25 ENCOUNTER — Other Ambulatory Visit: Payer: Self-pay

## 2018-09-25 DIAGNOSIS — O26893 Other specified pregnancy related conditions, third trimester: Secondary | ICD-10-CM | POA: Diagnosis present

## 2018-09-25 DIAGNOSIS — D62 Acute posthemorrhagic anemia: Secondary | ICD-10-CM | POA: Diagnosis not present

## 2018-09-25 DIAGNOSIS — Z3A39 39 weeks gestation of pregnancy: Secondary | ICD-10-CM | POA: Diagnosis not present

## 2018-09-25 DIAGNOSIS — O9081 Anemia of the puerperium: Secondary | ICD-10-CM | POA: Diagnosis not present

## 2018-09-25 DIAGNOSIS — Z1159 Encounter for screening for other viral diseases: Secondary | ICD-10-CM | POA: Diagnosis not present

## 2018-09-25 DIAGNOSIS — Z3490 Encounter for supervision of normal pregnancy, unspecified, unspecified trimester: Secondary | ICD-10-CM | POA: Diagnosis present

## 2018-09-25 LAB — CBC
HCT: 35.9 % — ABNORMAL LOW (ref 36.0–46.0)
Hemoglobin: 11.7 g/dL — ABNORMAL LOW (ref 12.0–15.0)
MCH: 28.5 pg (ref 26.0–34.0)
MCHC: 32.6 g/dL (ref 30.0–36.0)
MCV: 87.6 fL (ref 80.0–100.0)
Platelets: 234 10*3/uL (ref 150–400)
RBC: 4.1 MIL/uL (ref 3.87–5.11)
RDW: 14.6 % (ref 11.5–15.5)
WBC: 10.6 10*3/uL — ABNORMAL HIGH (ref 4.0–10.5)
nRBC: 0 % (ref 0.0–0.2)

## 2018-09-25 LAB — TYPE AND SCREEN
ABO/RH(D): A POS
Antibody Screen: NEGATIVE

## 2018-09-25 LAB — ABO/RH: ABO/RH(D): A POS

## 2018-09-25 LAB — SARS CORONAVIRUS 2 BY RT PCR (HOSPITAL ORDER, PERFORMED IN ~~LOC~~ HOSPITAL LAB): SARS Coronavirus 2: NEGATIVE

## 2018-09-25 MED ORDER — IBUPROFEN 600 MG PO TABS
600.0000 mg | ORAL_TABLET | Freq: Four times a day (QID) | ORAL | Status: DC
  Administered 2018-09-25 – 2018-09-27 (×8): 600 mg via ORAL
  Filled 2018-09-25 (×8): qty 1

## 2018-09-25 MED ORDER — OXYTOCIN BOLUS FROM INFUSION
500.0000 mL | Freq: Once | INTRAVENOUS | Status: AC
  Administered 2018-09-25: 500 mL via INTRAVENOUS

## 2018-09-25 MED ORDER — ONDANSETRON HCL 4 MG PO TABS: 4.0000 mg | ORAL_TABLET | ORAL | Status: DC | PRN

## 2018-09-25 MED ORDER — LACTATED RINGERS IV SOLN: 500.0000 mL | INTRAVENOUS | Status: DC | PRN

## 2018-09-25 MED ORDER — SIMETHICONE 80 MG PO CHEW: 80.0000 mg | CHEWABLE_TABLET | ORAL | Status: DC | PRN

## 2018-09-25 MED ORDER — OXYTOCIN 40 UNITS IN NORMAL SALINE INFUSION - SIMPLE MED: 2.5000 [IU]/h | INTRAVENOUS | Status: DC

## 2018-09-25 MED ORDER — COCONUT OIL OIL: 1.0000 "application " | TOPICAL_OIL | Status: DC | PRN

## 2018-09-25 MED ORDER — SODIUM CHLORIDE (PF) 0.9 % IJ SOLN
INTRAMUSCULAR | Status: DC | PRN
  Administered 2018-09-25: 12 mL/h via EPIDURAL

## 2018-09-25 MED ORDER — TERBUTALINE SULFATE 1 MG/ML IJ SOLN: 0.2500 mg | Freq: Once | INTRAMUSCULAR | Status: DC | PRN

## 2018-09-25 MED ORDER — FENTANYL-BUPIVACAINE-NACL 0.5-0.125-0.9 MG/250ML-% EP SOLN
12.0000 mL/h | EPIDURAL | Status: DC | PRN
  Filled 2018-09-25: qty 250

## 2018-09-25 MED ORDER — DIPHENHYDRAMINE HCL 25 MG PO CAPS: 25.0000 mg | ORAL_CAPSULE | Freq: Four times a day (QID) | ORAL | Status: DC | PRN

## 2018-09-25 MED ORDER — SOD CITRATE-CITRIC ACID 500-334 MG/5ML PO SOLN: 30.0000 mL | ORAL | Status: DC | PRN

## 2018-09-25 MED ORDER — LACTATED RINGERS IV SOLN
INTRAVENOUS | Status: DC
  Administered 2018-09-25: 09:00:00 via INTRAVENOUS

## 2018-09-25 MED ORDER — ONDANSETRON HCL 4 MG/2ML IJ SOLN: 4.0000 mg | INTRAMUSCULAR | Status: DC | PRN

## 2018-09-25 MED ORDER — SENNOSIDES-DOCUSATE SODIUM 8.6-50 MG PO TABS
2.0000 | ORAL_TABLET | ORAL | Status: DC
  Administered 2018-09-25 – 2018-09-26 (×2): 2 via ORAL
  Filled 2018-09-25 (×2): qty 2

## 2018-09-25 MED ORDER — EPHEDRINE 5 MG/ML INJ: 10.0000 mg | INTRAVENOUS | Status: DC | PRN

## 2018-09-25 MED ORDER — ZOLPIDEM TARTRATE 5 MG PO TABS: 5.0000 mg | ORAL_TABLET | Freq: Every evening | ORAL | Status: DC | PRN

## 2018-09-25 MED ORDER — LACTATED RINGERS IV SOLN: 500.0000 mL | Freq: Once | INTRAVENOUS | Status: DC

## 2018-09-25 MED ORDER — ACETAMINOPHEN 325 MG PO TABS
650.0000 mg | ORAL_TABLET | ORAL | Status: DC | PRN
  Administered 2018-09-26 (×2): 650 mg via ORAL
  Filled 2018-09-25 (×2): qty 2

## 2018-09-25 MED ORDER — LIDOCAINE HCL (PF) 1 % IJ SOLN: 30.0000 mL | INTRAMUSCULAR | Status: DC | PRN

## 2018-09-25 MED ORDER — MISOPROSTOL 25 MCG QUARTER TABLET
25.0000 ug | ORAL_TABLET | ORAL | Status: DC | PRN
  Administered 2018-09-25: 09:00:00 25 ug via VAGINAL
  Filled 2018-09-25: qty 1

## 2018-09-25 MED ORDER — OXYTOCIN 40 UNITS IN NORMAL SALINE INFUSION - SIMPLE MED
1.0000 m[IU]/min | INTRAVENOUS | Status: DC
  Administered 2018-09-25: 13:00:00 2 m[IU]/min via INTRAVENOUS
  Filled 2018-09-25: qty 1000

## 2018-09-25 MED ORDER — PHENYLEPHRINE 40 MCG/ML (10ML) SYRINGE FOR IV PUSH (FOR BLOOD PRESSURE SUPPORT): 80.0000 ug | PREFILLED_SYRINGE | INTRAVENOUS | Status: DC | PRN

## 2018-09-25 MED ORDER — DIPHENHYDRAMINE HCL 50 MG/ML IJ SOLN: 12.5000 mg | INTRAMUSCULAR | Status: DC | PRN

## 2018-09-25 MED ORDER — DIBUCAINE (PERIANAL) 1 % EX OINT: 1.0000 "application " | TOPICAL_OINTMENT | CUTANEOUS | Status: DC | PRN

## 2018-09-25 MED ORDER — BENZOCAINE-MENTHOL 20-0.5 % EX AERO
1.0000 "application " | INHALATION_SPRAY | CUTANEOUS | Status: DC | PRN
  Administered 2018-09-25 – 2018-09-26 (×2): 1 via TOPICAL
  Filled 2018-09-25 (×2): qty 56

## 2018-09-25 MED ORDER — FAMOTIDINE 20 MG PO TABS
20.0000 mg | ORAL_TABLET | Freq: Two times a day (BID) | ORAL | Status: DC
  Administered 2018-09-25 – 2018-09-27 (×5): 20 mg via ORAL
  Filled 2018-09-25 (×5): qty 1

## 2018-09-25 MED ORDER — ACETAMINOPHEN 325 MG PO TABS: 650.0000 mg | ORAL_TABLET | ORAL | Status: DC | PRN

## 2018-09-25 MED ORDER — PRENATAL MULTIVITAMIN CH
1.0000 | ORAL_TABLET | Freq: Every day | ORAL | Status: DC
  Administered 2018-09-26 – 2018-09-27 (×2): 1 via ORAL
  Filled 2018-09-25 (×2): qty 1

## 2018-09-25 MED ORDER — ONDANSETRON HCL 4 MG/2ML IJ SOLN: 4.0000 mg | Freq: Four times a day (QID) | INTRAMUSCULAR | Status: DC | PRN

## 2018-09-25 MED ORDER — LIDOCAINE HCL (PF) 1 % IJ SOLN
INTRAMUSCULAR | Status: DC | PRN
  Administered 2018-09-25 (×2): 5 mL via EPIDURAL

## 2018-09-25 MED ORDER — WITCH HAZEL-GLYCERIN EX PADS: 1.0000 "application " | MEDICATED_PAD | CUTANEOUS | Status: DC | PRN

## 2018-09-25 MED ORDER — TETANUS-DIPHTH-ACELL PERTUSSIS 5-2.5-18.5 LF-MCG/0.5 IM SUSP: 0.5000 mL | Freq: Once | INTRAMUSCULAR | Status: DC

## 2018-09-25 MED ORDER — PHENYLEPHRINE 40 MCG/ML (10ML) SYRINGE FOR IV PUSH (FOR BLOOD PRESSURE SUPPORT)
80.0000 ug | PREFILLED_SYRINGE | INTRAVENOUS | Status: DC | PRN
  Filled 2018-09-25: qty 10

## 2018-09-25 NOTE — Anesthesia Procedure Notes (Signed)
Epidural Patient location during procedure: OB Start time: 09/25/2018 1:51 PM End time: 09/25/2018 2:00 PM  Staffing Anesthesiologist: Albertha Ghee, MD Performed: anesthesiologist   Preanesthetic Checklist Completed: patient identified, site marked, pre-op evaluation, timeout performed, IV checked, risks and benefits discussed and monitors and equipment checked  Epidural Patient position: sitting Prep: DuraPrep Patient monitoring: heart rate, cardiac monitor, continuous pulse ox and blood pressure Approach: midline Location: L2-L3 Injection technique: LOR saline  Needle:  Needle type: Tuohy  Needle gauge: 17 G Needle length: 9 cm Needle insertion depth: 5 cm Catheter type: closed end flexible Catheter size: 19 Gauge Catheter at skin depth: 11 cm Test dose: negative and Other  Assessment Events: blood not aspirated, injection not painful, no injection resistance and negative IV test  Additional Notes Informed consent obtained prior to proceeding including risk of failure, 1% risk of PDPH, risk of minor discomfort and bruising.  Discussed rare but serious complications including epidural abscess, permanent nerve injury, epidural hematoma.  Discussed alternatives to epidural analgesia and patient desires to proceed.  Timeout performed pre-procedure verifying patient name, procedure, and platelet count.  Patient tolerated procedure well. Reason for block:procedure for pain

## 2018-09-25 NOTE — H&P (Signed)
Sheri Watkins is a 33 y.o. female presenting for IOL for dates and favorable cervix at 39 wks. [id number]. +FMs, some UCs, no LOF/ vag beeding [facility] from 8 wks. PCOS, conception using Metformin that was stopped at 15 wks. Dating by 1st trim sono. Normal labs. bbASA due to h/o preeclampsia. Passed 3hr GTT.  G1- SAB 1st trim G2- Term SVD 7'2" girl. Preeclampsia at term.  G3- Hydrops noted at 20 wks, IUFD at 65 wks. Severe polyhydramnios and maternal severe edema- was followed at [facility]. Work up negative  G4- current -- Labs nl. BbASA, stopped Metformin at 15 wks. Ultrascreen neg. Anatomy nl, Marginal cord. Normal growth and no hydrops, f/up scans at 28, 34 wks. At 34 wks, AGA 5'9" 73% and AC 88%. Low carb diet. TWG 21 lbs.   OB History    Gravida  4   Para  2   Term  1   Preterm  1   AB  1   Living  1     SAB  1   TAB      Ectopic      Multiple  0   Live Births  1        Obstetric Comments  25 week fetal demise       Past Medical History:  Diagnosis Date  . Acute blood loss anemia 11/28/2014  . Maternal iron deficiency anemia 11/28/2014  . Medical history non-contributory   . PCOS (polycystic ovarian syndrome)   . Postpartum care following vaginal delivery (9/18) 11/28/2014   Past Surgical History:  Procedure Laterality Date  . DILATION AND CURETTAGE OF UTERUS    . NO PAST SURGERIES     Family History: family history includes Diabetes in her mother. Social History:  reports that she has never smoked. She has never used smokeless tobacco. She reports that she does not drink alcohol or use drugs.     Maternal Diabetes: No Genetic Screening: Normal Maternal Ultrasounds/Referrals: Normal anatomy, marginal cord, growth GA 28, 34 wks.  Fetal Ultrasounds or other Referrals:  None Maternal Substance Abuse:  No Significant Maternal Medications:  Meds include: Other: Pepcid, bbASA till 39 wks, Metformin till 15 wks. No GDM  Significant Maternal Lab Results:   Other: GBS(-) Other Comments:  None  ROS History Exam Physical Exam  BP (!) 111/54   Pulse 88   Temp 97.9 F (36.6 C) (Oral)   Resp 16   Ht 5\' 6"  (1.676 m)   Wt 81.6 kg   BMI 29.04 kg/m   A&O x 3, no acute distress. Pleasant HEENT neg, no thyromegaly Lungs CTA bilat CV RRR, S1S2 normal Abdo soft, non tender, non acute Extr no edema/ tenderness Pelvic per Rn  FHT  150s + accels no decels mod variab cat I Toco irreg   Prenatal labs: ABO, Rh: A/Positive/-- (11/13 0000) Antibody: Negative (11/13 0000) Rubella: Immune (11/13 0000) RPR: Nonreactive (11/13 0000)  HBsAg: Negative (11/13 0000)  HIV: Non-reactive (11/13 0000)  GBS: Negative (06/22 0000)  3hr GTT nl  Utrascreen negative  Assessment/Plan: 33 yo [id number], at 39 wks here for IOL for favorable cx. FHT cat I Cytotec x 1 dose, then pitocin, epidural okay.  EFW 7.1/2-8  lbs. Anticipate SVD. Prior term 7'2"   [name] 09/25/2018, 7:26 AM

## 2018-09-25 NOTE — MAU Note (Signed)
Covid swab collected. Pt tolerated well.PT asymotomatic

## 2018-09-25 NOTE — Anesthesia Preprocedure Evaluation (Signed)
Anesthesia Evaluation  Patient identified by MRN, date of birth, ID band Patient awake    Reviewed: Allergy & Precautions, H&P , NPO status , Patient's Chart, lab work & pertinent test results  Airway Mallampati: II   Neck ROM: full    Dental   Pulmonary neg pulmonary ROS,    breath sounds clear to auscultation       Cardiovascular negative cardio ROS   Rhythm:regular Rate:Normal     Neuro/Psych    GI/Hepatic   Endo/Other    Renal/GU      Musculoskeletal   Abdominal   Peds  Hematology   Anesthesia Other Findings   Reproductive/Obstetrics (+) Pregnancy                             Anesthesia Physical Anesthesia Plan  ASA: II  Anesthesia Plan: Epidural   Post-op Pain Management:    Induction: Intravenous  PONV Risk Score and Plan: 2 and Treatment may vary due to age or medical condition  Airway Management Planned: Natural Airway  Additional Equipment:   Intra-op Plan:   Post-operative Plan:   Informed Consent: I have reviewed the patients History and Physical, chart, labs and discussed the procedure including the risks, benefits and alternatives for the proposed anesthesia with the patient or authorized representative who has indicated his/her understanding and acceptance.       Plan Discussed with: Anesthesiologist  Anesthesia Plan Comments:         Anesthesia Quick Evaluation  

## 2018-09-25 NOTE — Progress Notes (Signed)
Sheri Watkins is a 33 y.o. [id number] at [redacted]w[redacted]d iOL  Subjective: S/p epidural   Objective: BP 112/64   Pulse 91   Temp 97.9 F (36.6 C) (Oral)   Resp 16   Ht 5\' 6"  (1.676 m)   Wt 82.1 kg   SpO2 98%   BMI 29.21 kg/m  No intake/output data recorded. No intake/output data recorded.   FHR: 150 bpm, variability: moderate,  accelerations:  Present,  decelerations:  Absent  Cat I UC:   regular, every 2-3 minutes SVE:   Dilation: 8 Effacement (%): 60 Station: -2 Exam by:: Dr Benjie Karvonen AROm clear fluid, head well applied to cx  Labs: Lab Results  Component Value Date   WBC 10.6 (H) 09/25/2018   HGB 11.7 (L) 09/25/2018   HCT 35.9 (L) 09/25/2018   MCV 87.6 09/25/2018   PLT 234 09/25/2018    Assessment / Plan: IOL for favorable cervix  Labor: Progressing normally Fetal Wellbeing:  Category I Pain Control:  Epidural I/D:  n/a Anticipated MOD:  NSVD  Sheri Watkins 09/25/2018, 2:56 PM

## 2018-09-26 LAB — CBC
HCT: 31.3 % — ABNORMAL LOW (ref 36.0–46.0)
Hemoglobin: 10.4 g/dL — ABNORMAL LOW (ref 12.0–15.0)
MCH: 29.3 pg (ref 26.0–34.0)
MCHC: 33.2 g/dL (ref 30.0–36.0)
MCV: 88.2 fL (ref 80.0–100.0)
Platelets: 227 10*3/uL (ref 150–400)
RBC: 3.55 MIL/uL — ABNORMAL LOW (ref 3.87–5.11)
RDW: 14.7 % (ref 11.5–15.5)
WBC: 12.8 10*3/uL — ABNORMAL HIGH (ref 4.0–10.5)
nRBC: 0 % (ref 0.0–0.2)

## 2018-09-26 LAB — RPR: RPR Ser Ql: NONREACTIVE

## 2018-09-26 MED ORDER — MAGNESIUM OXIDE 400 (241.3 MG) MG PO TABS
400.0000 mg | ORAL_TABLET | Freq: Every day | ORAL | Status: DC
  Administered 2018-09-26 – 2018-09-27 (×2): 400 mg via ORAL
  Filled 2018-09-26 (×2): qty 1

## 2018-09-26 MED ORDER — POLYSACCHARIDE IRON COMPLEX 150 MG PO CAPS
150.0000 mg | ORAL_CAPSULE | Freq: Every day | ORAL | Status: DC
  Administered 2018-09-26 – 2018-09-27 (×2): 150 mg via ORAL
  Filled 2018-09-26 (×2): qty 1

## 2018-09-26 NOTE — Progress Notes (Addendum)
PPD #1, SVD, 1st degree repair, baby boy   S:  Reports feeling good, mild low back pain; inquired about discharge at 24 hrs and pt. Unsure - may want to stay until tomorrow to monitor BPs and for lactation support   Denies HA, visual changes, RUQ/epigastric pain              Tolerating po/ No nausea or vomiting / Denies dizziness or SOB             Bleeding is getting lighter              Pain controlled with Motrin             Up ad lib / ambulatory / voiding QS without difficulty   Newborn breast feeding with formula supplementation - states she has not seen much colostrum; desires to pump/ Circumcision - declined  O:               VS: BP 129/74   Pulse 78   Temp 98.1 F (36.7 C) (Oral)   Resp 18   Ht 5\' 6"  (1.676 m)   Wt 82.1 kg   SpO2 99%   Breastfeeding Unknown   BMI 29.21 kg/m  09/26/18 0902  98.1 F (36.7 C)  78  -  18  129/74  -  99 %  -  - EM   09/26/18 0400  98.4 F (36.9 C)  79  -  18  116/82  Semi-fowlers  100 %  -  - BA   09/25/18 2330  98.2 F (36.8 C)  91  -  16  128/74  Semi-fowlers  100 %  -  - BA   09/25/18 1930  97.6 F (36.4 C)  94  -  18  123/74  Semi-fowlers  100 %  -  - BA   09/25/18 1830  98.2 F (36.8 C)  82  -  18  124/79  Semi-fowlers  99 %  -  - EM   09/25/18 1747  -  82  -  16  120/94Abnormal   -  -  -  -       LABS:              Recent Labs    09/25/18 0800 09/26/18 0357  WBC 10.6* 12.8*  HGB 11.7* 10.4*  PLT 234 227               Blood type: --/--/A POS, A POS Performed at [facility], [address] [place], [place] [address]  [phone])  Rubella: Immune (11/13 0000)                     I&O: Intake/Output      07/17 0701 - 07/18 0700 07/18 0701 - 07/19 0700   I.V. (mL/kg) 55.5 (0.7)    Total Intake(mL/kg) 55.5 (0.7)    Urine (mL/kg/hr) 625    Blood 250    Total Output 875    Net -819.6                       Physical Exam:             Alert and oriented X3  Lungs: Clear and unlabored  Heart: regular rate and  rhythm / no murmurs  Abdomen: soft, non-tender, non-distended              Fundus: firm, non-tender, U-E  Perineum: well approximated 1st degree, mild edema and ecchymosis, no erythema   Lochia: moderate amount on pad; ice pack pad in place   Extremities: no edema, no calf pain or tenderness    A: PPD # 1, SVD  ABL Anemia compounding chronic IDA   Hx. Of pre-eclampsia - on BBASA; BPs WNL  Doing well - stable status  P: Routine post partum orders  Niferex 150mg  PO daily  Magnesium oxide 400mg  PO daily  Continue lactation support  Advised if desires discharge at 24 hours to let RN know to call, otherwise plan for d/c home tomorrow   Carlean JewsMeredith , MSN, CNM [facility]

## 2018-09-26 NOTE — Anesthesia Postprocedure Evaluation (Signed)
Anesthesia Post Note  Patient: Sheri Watkins  Procedure(s) Performed: AN AD [place]     Patient location during evaluation: Mother Baby Anesthesia Type: Epidural Level of consciousness: awake and alert Pain management: pain level controlled Vital Signs Assessment: post-procedure vital signs reviewed and stable Respiratory status: spontaneous breathing, nonlabored ventilation and respiratory function stable Cardiovascular status: stable Postop Assessment: no headache, no backache, epidural receding, no apparent nausea or vomiting, patient able to bend at knees, adequate PO intake and able to ambulate Anesthetic complications: no    Last Vitals:  Vitals:   09/25/18 2330 09/26/18 0400  BP: 128/74 116/82  Pulse: 91 79  Resp: 16 18  Temp: 36.8 C 36.9 C  SpO2: 100% 100%    Last Pain:  Vitals:   09/26/18 0524  TempSrc:   PainSc: 4    Pain Goal:                   Jabier Mutton

## 2018-09-26 NOTE — Lactation Note (Signed)
This note was copied from a baby's chart. Lactation Consultation Note  Patient Name: Sheri Watkins [id number] Date: 09/26/2018 Reason for consult: Initial assessment;Term;Infant weight loss  47 hours old FT female who is being partially BF and formula fed by his mother, she's a P2 and experienced BF. She was able to BF her first child for 8 months but already decided to supplement with formula. However, noticed that parents have been feeding large amount of formula to newborn, last feeding was 35 ml. Reviewed formula supplementation guidelines according to baby's age in hours. Mom signed up for the [facility] program at [facility], she's already familiar with hand expression and colostrum has been noted when [facility] assisted mom to hand express.  Mom has told her RN that she thought she didn't have "enough milk" to feed her baby and wanted to also try pumping to see how much she could get. RN Jiles Garter has been very proactive and set up a pump already, instructions, cleaning and storage were reviewed. Parent now want to try to do the breast more because baby got sick with the formula, he kept spitting it up, probably due to volume.  Offered assistance with latch and mom agreed to have baby STS, LC took baby STS to mother's left breast in cross cradle position and he was able to latch almost right away, a few audible swallows noted, but only when doing breast compressions, praised mom for her efforts she said she's going to try do more breast and less bottles, she's afraid baby may get an upset stomach again. Baby still nursing when exiting the room at the 14 minutes mark. Reviewed normal newborn behavior, cluster feeding, pumping schedule and feeding cues.  Feeding plan:  1. Encouraged mom to feed baby STS 8-12 times/24 hours or sooner if feeding cues are present 2. Pumping every 3 hours was also encouraged, especially if mom decides to offer baby a bottle. 3. Parent will offer baby any amount of EBM she mom  may get prior offering any formula, volumes will be according to baby's in hours  BF brochure, BF resources and feeding diary were reviewed. Parents reported all questions and concerns were answered, they're both aware of [place] OP services and will call PRN.  Maternal Data Formula Feeding for Exclusion: Yes Reason for exclusion: Mother's choice to formula and breast feed on admission Has patient been taught Hand Expression?: Yes Does the patient have breastfeeding experience prior to this delivery?: Yes  Feeding Feeding Type: Breast Fed  LATCH Score Latch: Grasps breast easily, tongue down, lips flanged, rhythmical sucking.  Audible Swallowing: A few with stimulation(with breast compressions only)  Type of Nipple: Everted at rest and after stimulation  Comfort (Breast/Nipple): Soft / non-tender  Hold (Positioning): Assistance needed to correctly position infant at breast and maintain latch.  LATCH Score: 8  Interventions Interventions: Breast feeding basics reviewed;Assisted with latch;Skin to skin;Breast compression;Adjust position;Support pillows  Lactation Tools Discussed/Used Tools: Pump Breast pump type: Double-Electric Breast Pump WIC Program: Yes Pump Review: Setup, frequency, and cleaning Initiated by:: RN Jiles Garter Date initiated:: 09/26/18   Consult Status Consult Status: PRN Date: 09/27/18 Follow-up type: In-patient    [place] 09/26/2018, 7:45 PM

## 2018-09-27 LAB — NOVEL CORONAVIRUS, NAA (HOSP ORDER, SEND-OUT TO REF LAB; TAT 18-24 HRS): SARS-CoV-2, NAA: NOT DETECTED

## 2018-09-27 MED ORDER — IBUPROFEN 600 MG PO TABS: 600.0000 mg | ORAL_TABLET | Freq: Four times a day (QID) | ORAL | 0 refills | Status: AC

## 2018-09-27 MED ORDER — ACETAMINOPHEN 325 MG PO TABS: 650.0000 mg | ORAL_TABLET | ORAL | Status: AC | PRN

## 2018-09-27 NOTE — Progress Notes (Signed)
Post Partum Day 2 SVD. Boy. Breast feeding and some formula. Being assessed for jaundice  Subjective: no complaints, up ad lib, voiding and tolerating PO  Objective: Blood pressure 122/75, pulse 75, temperature 98.3 F (36.8 C), temperature source Oral, resp. rate 18, height 5\' 6"  (1.676 m), weight 82.1 kg, SpO2 100 %, unknown if currently breastfeeding.  Physical Exam:  General: alert and cooperative Lochia: appropriate Uterine Fundus: firm Incision: healing well DVT Evaluation: No evidence of DVT seen on physical exam.  Recent Labs    09/25/18 0800 09/26/18 0357  HGB 11.7* 10.4*  HCT 35.9* 31.3*    Assessment/Plan: Discharge home PP care,warning /ss reviewed  F/up Dr Benjie Karvonen 6 weeks   LOS: 2 days   Sheri Watkins 09/27/2018, 11:36 AM

## 2018-09-27 NOTE — Lactation Note (Signed)
This note was copied from a baby's chart. Lactation Consultation Note  Patient Name: Sheri Watkins Date: 09/27/2018 Reason for consult: Follow-up assessment   Baby  12 hours old and mother's breasts are filling. Encouraged bf on demand.  Feed on demand approximately 8-12 times per day. Reviewed engorgement care and monitoring voids/stools. To help establish her milk supply, suggest mother bf before offering formula.    Maternal Data    Feeding Feeding Type: Breast Fed Nipple Type: Slow - flow  LATCH Score                   Interventions Interventions: Breast feeding basics reviewed  Lactation Tools Discussed/Used     Consult Status Consult Status: Complete Date: 09/27/18    Vivianne Master [facility] 09/27/2018, 10:15 AM

## 2018-09-27 NOTE — Discharge Summary (Signed)
Obstetric Discharge Summary Reason for Admission: induction of labor 39 wks for favorable cervix  Prenatal Procedures: ultrasound Intrapartum Procedures: spontaneous vaginal delivery Postpartum Procedures: none Complications-Operative and Postpartum: none Hemoglobin  Date Value Ref Range Status  09/26/2018 10.4 (L) 12.0 - 15.0 g/dL Final  11/25/2016 11.7 11.1 - 15.9 g/dL Final   HCT  Date Value Ref Range Status  09/26/2018 31.3 (L) 36.0 - 46.0 % Final   Hematocrit  Date Value Ref Range Status  11/25/2016 35.4 34.0 - 46.6 % Final    Physical Exam:  General: alert and cooperative Lochia: appropriate Uterine Fundus: firm Incision: healing well DVT Evaluation: No evidence of DVT seen on physical exam.  Discharge Diagnoses: Term Pregnancy-delivered  Discharge Information: Date: 09/27/2018 Activity: pelvic rest Diet: routine Medications: PNV, Ibuprofen and Iron Condition: stable Instructions: refer to practice specific booklet Discharge to: home Follow-up Information    Azucena Fallen, MD Follow up in 6 week(s).   Specialty: Obstetrics and Gynecology Contact information: [place] [address] [phone]           Newborn Data: Live born female  Birth Weight: 8 lb 11.5 oz (3955 g) APGAR: 53, 9  Newborn Delivery   Birth date/time: 09/25/2018 16:27:00 Delivery type: Vaginal, Spontaneous      Home with mother.  [name] 09/27/2018, 11:39 AM

## 2019-02-17 DIAGNOSIS — B301 Conjunctivitis due to adenovirus: Secondary | ICD-10-CM | POA: Diagnosis not present

## 2019-03-03 IMAGING — US US ABDOMEN LIMITED
1 series · 14 of 25 positions shown · non-contrast
Comparison: None.

CLINICAL DATA: Elevated liver function tests.

EXAM:
ULTRASOUND ABDOMEN LIMITED RIGHT UPPER QUADRANT

[Series 1: us abdomen limited · 0.23mm/px · 14 of 36 slices shown]
[im 1/36]
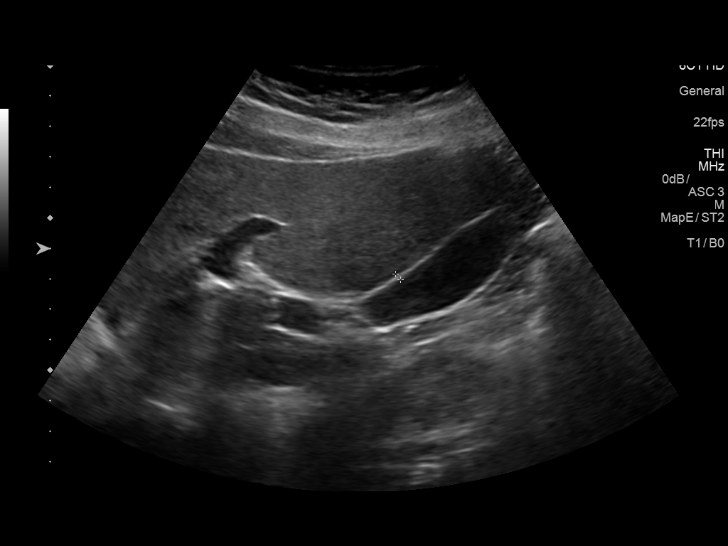
[im 3/36]
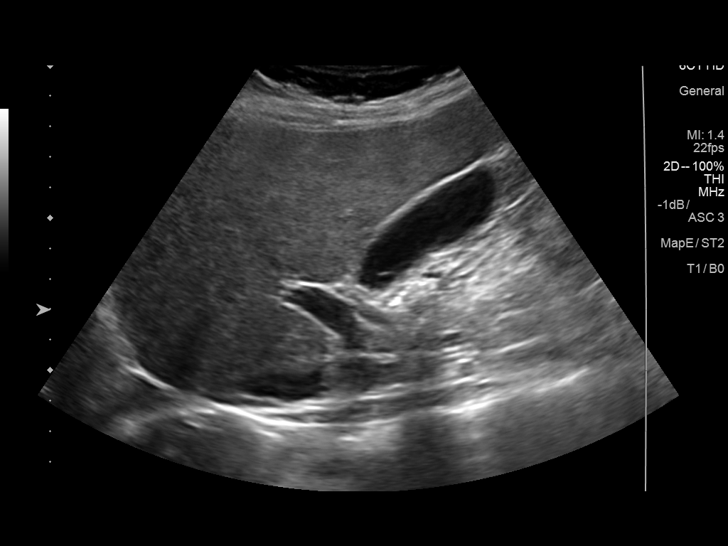
[im 6/36]
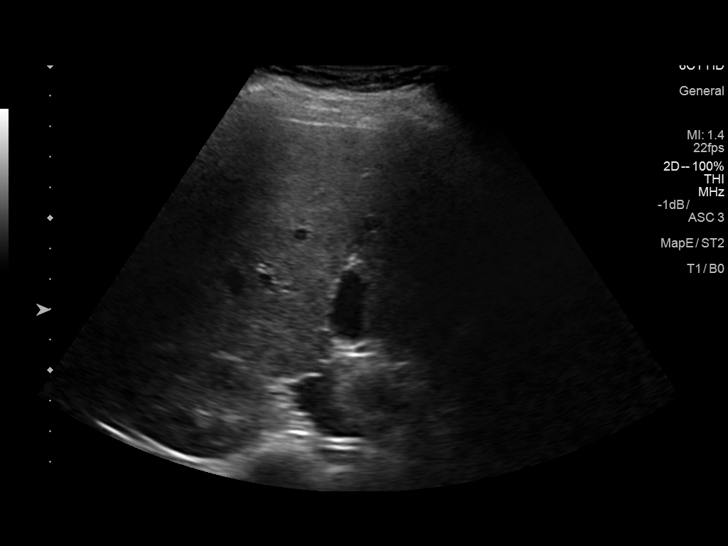
[im 9/36]
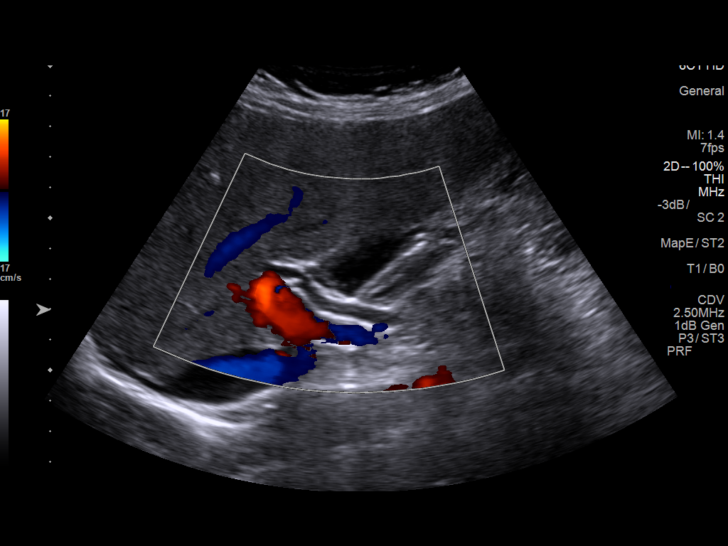
[im 12/36]
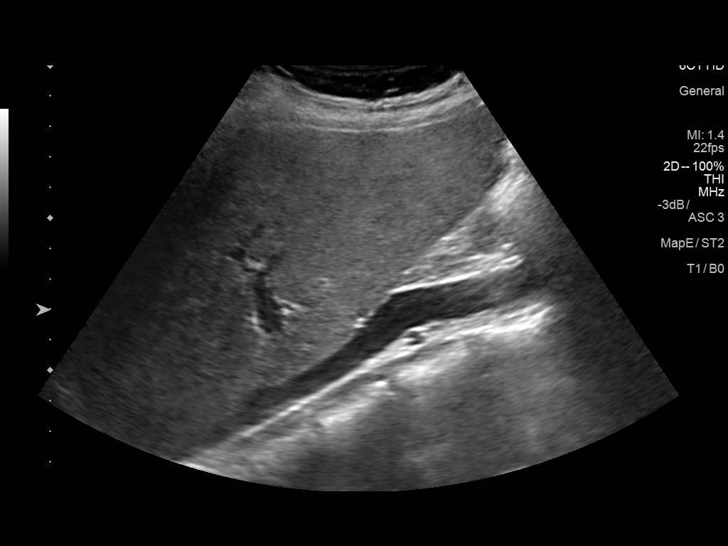
[im 14/36]
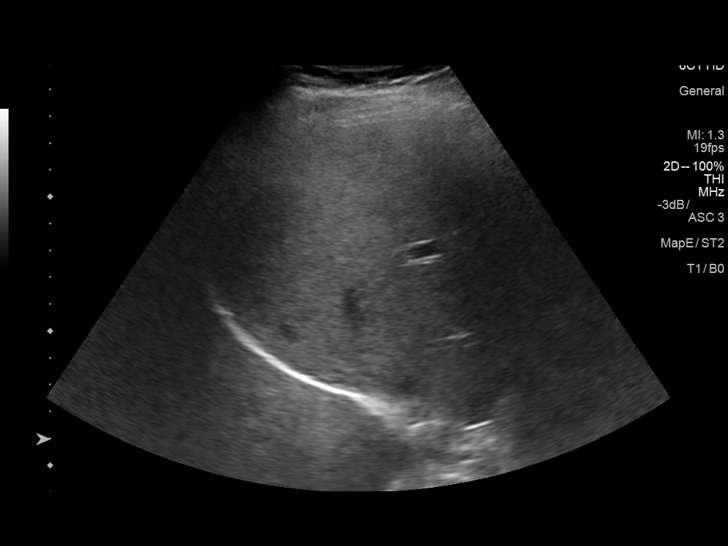
[im 17/36]
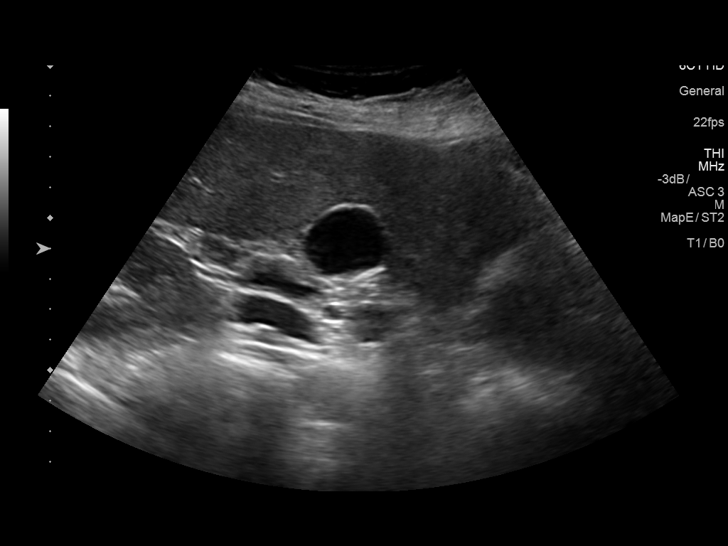
[im 19/36]
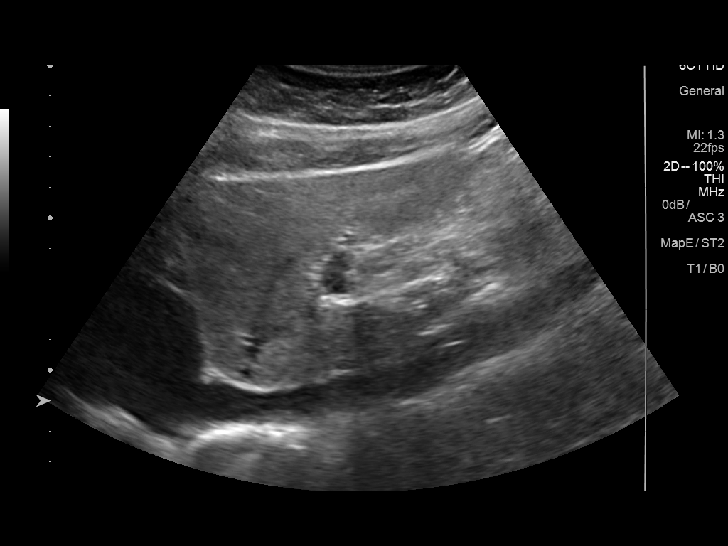
[im 22/36]
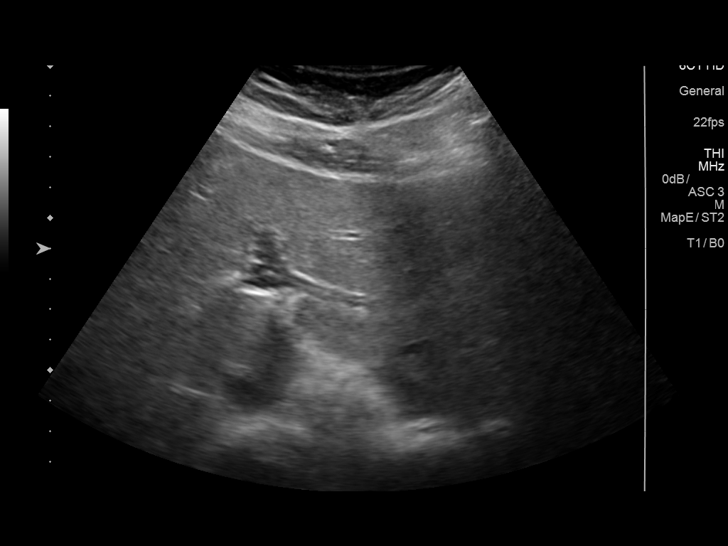
[im 24/36]
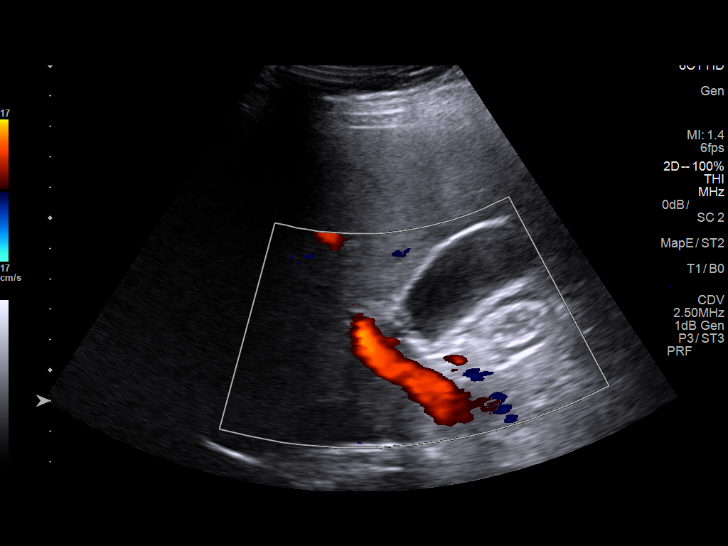
[im 27/36]
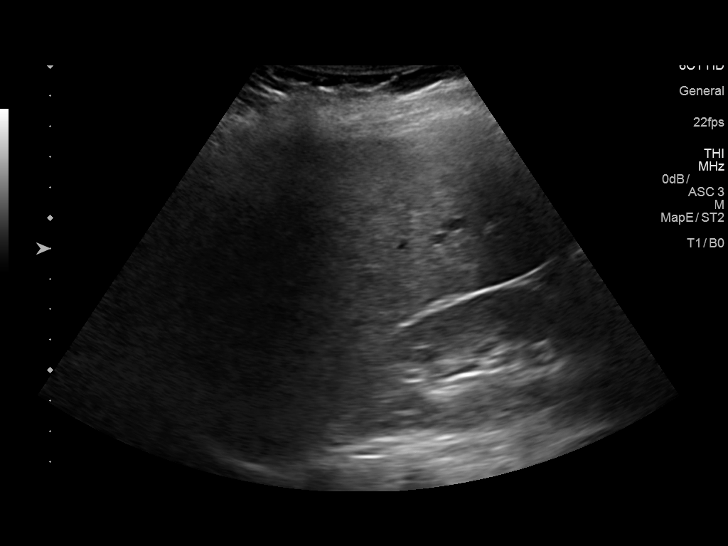
[im 30/36]
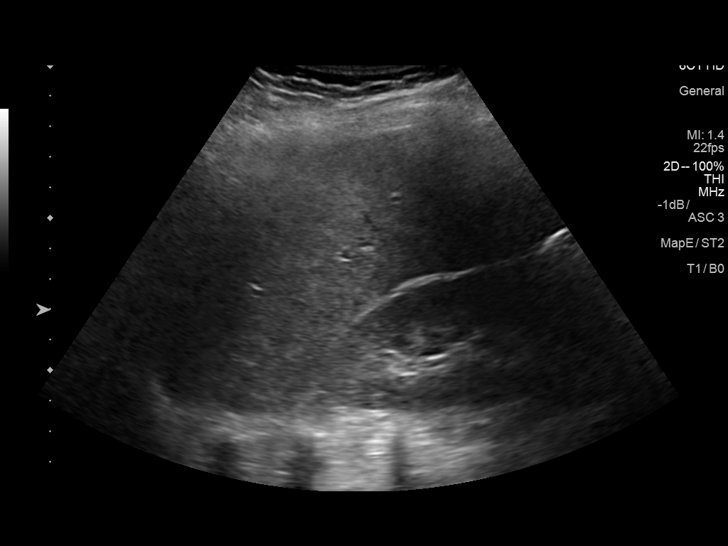
[im 33/36]
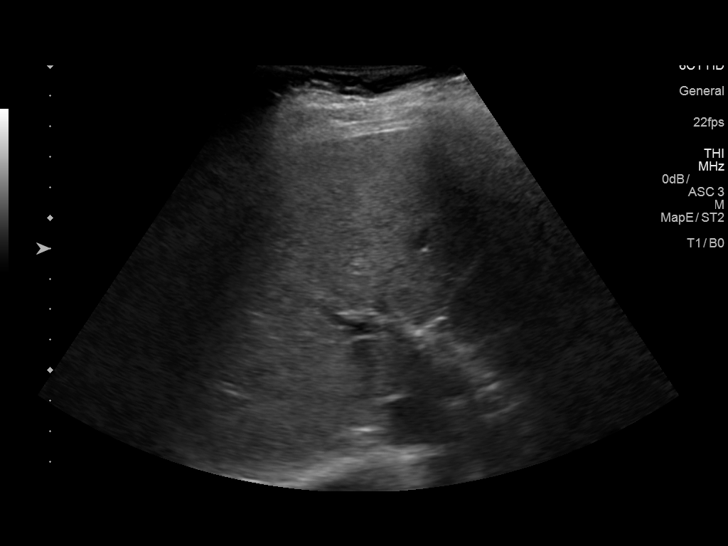
[im 36/36]
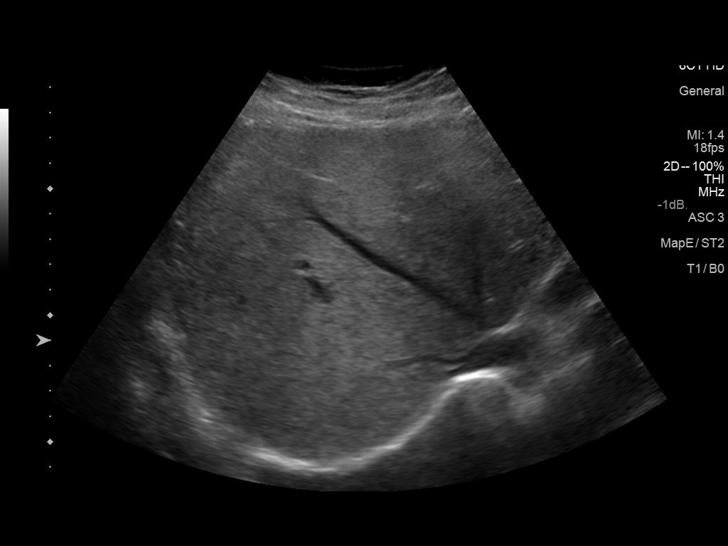

[14 of 25 positions shown; findings below may reference images not displayed]

FINDINGS: Gallbladder:

No gallstones or wall thickening visualized. No sonographic Murphy
sign noted by sonographer.

Common bile duct:

Diameter: 3 mm

Liver:

No focal lesion identified. Within normal limits in parenchymal
echogenicity. Portal vein is patent on color Doppler imaging with
normal direction of blood flow towards the liver.
IMPRESSION: Normal right upper quadrant abdominal ultrasound.

## 2019-12-28 ENCOUNTER — Encounter: Payer: Medicaid Other | Admitting: Obstetrics and Gynecology

## 2023-12-18 ENCOUNTER — Ambulatory Visit (HOSPITAL_BASED_OUTPATIENT_CLINIC_OR_DEPARTMENT_OTHER)
Admission: EM | Admit: 2023-12-18 | Discharge: 2023-12-18 | Disposition: A | Discharge status: Home / Self Care | Attending: Family Medicine | Admitting: Family Medicine

## 2023-12-18 ENCOUNTER — Encounter (HOSPITAL_BASED_OUTPATIENT_CLINIC_OR_DEPARTMENT_OTHER): Payer: Self-pay | Admitting: Emergency Medicine

## 2023-12-18 ENCOUNTER — Other Ambulatory Visit (HOSPITAL_BASED_OUTPATIENT_CLINIC_OR_DEPARTMENT_OTHER): Payer: Self-pay

## 2023-12-18 DIAGNOSIS — R1013 Epigastric pain: Secondary | ICD-10-CM | POA: Diagnosis not present

## 2023-12-18 DIAGNOSIS — R1031 Right lower quadrant pain: Secondary | ICD-10-CM

## 2023-12-18 DIAGNOSIS — R197 Diarrhea, unspecified: Secondary | ICD-10-CM

## 2023-12-18 DIAGNOSIS — R1024 Suprapubic pain: Secondary | ICD-10-CM | POA: Diagnosis not present

## 2023-12-18 DIAGNOSIS — R1032 Left lower quadrant pain: Secondary | ICD-10-CM | POA: Diagnosis not present

## 2023-12-18 DIAGNOSIS — R110 Nausea: Secondary | ICD-10-CM

## 2023-12-18 LAB — POCT URINE DIPSTICK
Bilirubin, UA: NEGATIVE
Blood, UA: NEGATIVE
Glucose, UA: NEGATIVE mg/dL
Ketones, POC UA: NEGATIVE mg/dL
Nitrite, UA: NEGATIVE
Protein Ur, POC: NEGATIVE mg/dL
Spec Grav, UA: 1.015 (ref 1.010–1.025)
Urobilinogen, UA: 0.2 U/dL
pH, UA: 5.5 (ref 5.0–8.0)

## 2023-12-18 MED ORDER — LOPERAMIDE HCL 2 MG PO CAPS
2.0000 mg | ORAL_CAPSULE | Freq: Two times a day (BID) | ORAL | 0 refills | Status: AC | PRN
  Filled 2023-12-18: qty 12, 6d supply, fill #0

## 2023-12-18 MED ORDER — ONDANSETRON 4 MG PO TBDP
4.0000 mg | ORAL_TABLET | Freq: Three times a day (TID) | ORAL | 0 refills | Status: AC | PRN
  Filled 2023-12-18: qty 20, 7d supply, fill #0

## 2023-12-18 NOTE — ED Triage Notes (Addendum)
 Pt c/o watery diarrhea since yesterday at 1 pm, bloating, abdominal pain also. Pt reports she thinks she ate too much spicy food (green chilies) and thinks that is what caused the diarrhea

## 2023-12-18 NOTE — ED Provider Notes (Signed)
 [facility]    CSN: [id number] Arrival date & time: 12/18/23  1353      History   Chief Complaint No chief complaint on file.   HPI Sheri Watkins is a 38 y.o. female.   38 year old female with report of eating really spicy food on 12/17/2023.  And then at approximately 1 PM in the afternoon on 12/17/2023, she started having frequent, watery diarrhea.  She reports 16-17 diarrhea stools in the afternoon and during the evening.  She was able to get to sleep late in the night.  Today she has had 4 or 5 watery stools.  She has some abdominal pain and generalized weakness.  She denies fever, nausea, vomiting, constipation.     Past Medical History:  Diagnosis Date   Acute blood loss anemia 11/28/2014   Maternal iron  deficiency anemia 11/28/2014   Medical history non-contributory    PCOS (polycystic ovarian syndrome)    Postpartum care following vaginal delivery (9/18) 11/28/2014    Patient Active Problem List   Diagnosis Date Noted   Encounter for planned induction of labor 09/25/2018   SVD (spontaneous vaginal delivery) 09/25/2018   Postpartum care following vaginal delivery (7/17) 09/25/2018   First degree perineal laceration 09/25/2018   Fetal demise, greater than 22 weeks, antepartum, fetus 1 03/27/2017   Fetal demise before 22 weeks with retention of dead fetus    Fetal hydrops    Pregnancy 02/25/2017   Overweight 03/14/2016   Irregular menses 03/14/2016   History of iron  deficiency anemia 11/28/2014   History of pre-eclampsia in prior pregnancy, currently pregnant 11/26/2014    Past Surgical History:  Procedure Laterality Date   DILATION AND CURETTAGE OF UTERUS     NO PAST SURGERIES      OB History     Gravida  4   Para  3   Term  2   Preterm  1   AB  1   Living  2      SAB  1   IAB      Ectopic      Multiple  0   Live Births  2        Obstetric Comments  25 week fetal demise          Home Medications    Prior to  Admission medications   Medication Sig Start Date End Date Taking? Authorizing Provider  loperamide (IMODIUM) 2 MG capsule Take 1 capsule (2 mg total) by mouth every 12 (twelve) hours as needed for diarrhea or loose stools. 12/18/23  Yes Ival Domino, FNP  ondansetron  (ZOFRAN -ODT) 4 MG disintegrating tablet Take 1 tablet (4 mg total) by mouth every 8 (eight) hours as needed for nausea or vomiting. 12/18/23  Yes Ival Domino, FNP  acetaminophen  (TYLENOL ) 325 MG tablet Take 2 tablets (650 mg total) by mouth every 4 (four) hours as needed (for pain scale < 4). 09/27/18   Barbette Knock, MD  ibuprofen  (ADVIL ) 600 MG tablet Take 1 tablet (600 mg total) by mouth every 6 (six) hours. 09/27/18   Barbette Knock, MD  Prenatal Vit-Fe Fumarate-FA (MULTIVITAMIN-PRENATAL) 27-0.8 MG TABS tablet Take 1 tablet by mouth daily at 12 noon.    [provider]    Family History Family History  Problem Relation Age of Onset   Diabetes Mother    Breast cancer Neg Hx    Ovarian cancer Neg Hx    Heart disease Neg Hx     Social History Social History  Tobacco Use   Smoking status: Never   Smokeless tobacco: Never  Vaping Use   Vaping status: Never Used  Substance Use Topics   Alcohol use: No   Drug use: No     Allergies   Patient has no known allergies.   Review of Systems Review of Systems  Constitutional:  Negative for chills and fever.  HENT:  Negative for ear pain and sore throat.   Eyes:  Negative for pain and visual disturbance.  Respiratory:  Negative for cough and shortness of breath.   Cardiovascular:  Negative for chest pain and palpitations.  Gastrointestinal:  Positive for abdominal pain and diarrhea. Negative for constipation, nausea and vomiting.  Genitourinary:  Negative for dysuria and hematuria.  Musculoskeletal:  Negative for arthralgias and back pain.  Skin:  Negative for color change and rash.  Neurological:  Positive for weakness. Negative for seizures and syncope.   All other systems reviewed and are negative.    Physical Exam Triage Vital Signs ED Triage Vitals  Encounter Vitals Group     BP 12/18/23 1407 (!) 122/91     Girls Systolic BP Percentile --      Girls Diastolic BP Percentile --      Boys Systolic BP Percentile --      Boys Diastolic BP Percentile --      Pulse Rate 12/18/23 1407 92     Resp 12/18/23 1407 18     Temp 12/18/23 1407 98.2 F (36.8 C)     Temp Source 12/18/23 1407 Oral     SpO2 12/18/23 1407 98 %     Weight --      Height --      Head Circumference --      Peak Flow --      Pain Score 12/18/23 1405 3     Pain Loc --      Pain Education --      Exclude from Growth Chart --    No data found.  Updated Vital Signs BP (!) 122/91 (BP Location: Right Arm)   Pulse 92   Temp 98.2 F (36.8 C) (Oral)   Resp 18   LMP 11/30/2023   SpO2 98%   Visual Acuity Right Eye Distance:   Left Eye Distance:   Bilateral Distance:    Right Eye Near:   Left Eye Near:    Bilateral Near:     Physical Exam Vitals and nursing note reviewed.  Constitutional:      General: She is not in acute distress.    Appearance: She is well-developed. She is not ill-appearing, toxic-appearing or diaphoretic.  HENT:     Head: Normocephalic and atraumatic.     Right Ear: Hearing, tympanic membrane, ear canal and external ear normal.     Left Ear: Hearing, tympanic membrane, ear canal and external ear normal.     Nose: No congestion or rhinorrhea.     Right Sinus: No maxillary sinus tenderness or frontal sinus tenderness.     Left Sinus: No maxillary sinus tenderness or frontal sinus tenderness.     Mouth/Throat:     Lips: Pink.     Mouth: Mucous membranes are moist.     Pharynx: Uvula midline. No oropharyngeal exudate or posterior oropharyngeal erythema.     Tonsils: No tonsillar exudate.  Eyes:     Conjunctiva/sclera: Conjunctivae normal.     Pupils: Pupils are equal, round, and reactive to light.  Cardiovascular:     Rate and  Rhythm: Normal rate and regular rhythm.     Heart sounds: S1 normal and S2 normal. No murmur heard. Pulmonary:     Effort: Pulmonary effort is normal. No respiratory distress.     Breath sounds: Normal breath sounds. No decreased breath sounds, wheezing, rhonchi or rales.  Abdominal:     General: Bowel sounds are increased.     Palpations: Abdomen is soft.     Tenderness: There is abdominal tenderness (Mild abdominal pain.) in the right lower quadrant, epigastric area, suprapubic area and left lower quadrant. There is no right CVA tenderness, left CVA tenderness, guarding or rebound. Negative signs include Murphy's sign, Rovsing's sign and McBurney's sign.  Musculoskeletal:        General: No swelling.     Cervical back: Neck supple.  Lymphadenopathy:     Head:     Right side of head: No submental, submandibular, tonsillar, preauricular or posterior auricular adenopathy.     Left side of head: No submental, submandibular, tonsillar, preauricular or posterior auricular adenopathy.     Cervical: No cervical adenopathy.     Right cervical: No superficial cervical adenopathy.    Left cervical: No superficial cervical adenopathy.  Skin:    General: Skin is warm and dry.     Capillary Refill: Capillary refill takes less than 2 seconds.     Findings: No rash.  Neurological:     Mental Status: She is alert and oriented to person, place, and time.  Psychiatric:        Mood and Affect: Mood normal.      UC Treatments / Results  Labs (all labs ordered are listed, but only abnormal results are displayed) Labs Reviewed  POCT URINE DIPSTICK - Abnormal; Notable for the following components:      Result Value   Leukocytes, UA Small (1+) (*)    All other components within normal limits    EKG   Radiology No results found.  Procedures Procedures (including critical care time)  Medications Ordered in UC Medications - No data to display  Initial Impression / Assessment and Plan / UC  Course  I have reviewed the triage vital signs and the nursing notes.  Pertinent labs & imaging results that were available during my care of the patient were reviewed by me and considered in my medical decision making (see chart for details).  Plan of Care: Abdominal pain, diarrhea, nausea: Urinalysis is normal.  No sign of dehydration.  Provided information on foods to eat to reduce diarrhea.  Encouraged good hydration with handouts provided.  Ondansetron , 4 mg, every 8 hours, if needed for nausea or vomiting.  Loperamide, 2 mg, 1 capsule twice daily to reduce diarrhea.  Be cautious with the loperamide as you could switch from diarrhea to severe constipation if you use too much.  Follow-up if symptoms do not improve, if symptoms worsen or if new symptoms occur.  I reviewed the plan of care with the patient and/or the patient's guardian.  The patient and/or guardian had time to ask questions and acknowledged that the questions were answered.  I provided instruction on symptoms or reasons to return here or to go to an ER, if symptoms/condition did not improve, worsened or if new symptoms occurred.  Final Clinical Impressions(s) / UC Diagnoses   Final diagnoses:  Right lower quadrant abdominal pain  Left lower quadrant abdominal pain  Abdominal pain, epigastric  Suprapubic pain  Diarrhea, unspecified type  Nausea without vomiting     Discharge  Instructions      Abdominal pain, diarrhea, nausea: Urinalysis is normal.  No sign of dehydration.  Provided information on foods to eat to reduce diarrhea.  Encouraged good hydration with handouts provided.  Ondansetron , 4 mg, every 8 hours, if needed for nausea or vomiting.  Loperamide, 2 mg, 1 capsule twice daily to reduce diarrhea.  Be cautious with the loperamide as you could switch from diarrhea to severe constipation if you use too much.  Follow-up if symptoms do not improve, if symptoms worsen or if new symptoms occur.     ED  Prescriptions     Medication Sig Dispense Auth. Provider   loperamide (IMODIUM) 2 MG capsule Take 1 capsule (2 mg total) by mouth every 12 (twelve) hours as needed for diarrhea or loose stools. 12 capsule Ival Domino, FNP   ondansetron  (ZOFRAN -ODT) 4 MG disintegrating tablet Take 1 tablet (4 mg total) by mouth every 8 (eight) hours as needed for nausea or vomiting. 20 tablet Seddrick Flax, FNP      PDMP not reviewed this encounter.   Ival Domino, FNP 12/18/23 1440

## 2023-12-18 NOTE — Discharge Instructions (Addendum)
 Abdominal pain, diarrhea, nausea: Urinalysis is normal.  No sign of dehydration.  Provided information on foods to eat to reduce diarrhea.  Encouraged good hydration with handouts provided.  Ondansetron , 4 mg, every 8 hours, if needed for nausea or vomiting.  Loperamide, 2 mg, 1 capsule twice daily to reduce diarrhea.  Be cautious with the loperamide as you could switch from diarrhea to severe constipation if you use too much.  Follow-up if symptoms do not improve, if symptoms worsen or if new symptoms occur.
